# Patient Record
Sex: Female | Born: 1990 | Race: Black or African American | Hispanic: No | Marital: Single | Smoking: Never smoker
Health system: Southern US, Community
[De-identification: ages and names within clinical notes are randomized; demographics above are authoritative.]

## PROBLEM LIST (undated history)

## (undated) DIAGNOSIS — G4730 Sleep apnea, unspecified: Secondary | ICD-10-CM

---

## 2019-11-07 ENCOUNTER — Encounter (HOSPITAL_COMMUNITY): Payer: Self-pay | Admitting: Emergency Medicine

## 2019-11-07 ENCOUNTER — Emergency Department (HOSPITAL_COMMUNITY)
Admission: EM | Admit: 2019-11-07 | Discharge: 2019-11-07 | Disposition: A | Discharge status: Home / Self Care | Payer: PRIVATE HEALTH INSURANCE | Attending: Emergency Medicine | Admitting: Emergency Medicine

## 2019-11-07 ENCOUNTER — Other Ambulatory Visit: Payer: Self-pay

## 2019-11-07 DIAGNOSIS — R0981 Nasal congestion: Secondary | ICD-10-CM | POA: Diagnosis not present

## 2019-11-07 DIAGNOSIS — Z5321 Procedure and treatment not carried out due to patient leaving prior to being seen by health care provider: Secondary | ICD-10-CM | POA: Insufficient documentation

## 2019-11-07 HISTORY — DX: Sleep apnea, unspecified: G47.30

## 2019-11-07 NOTE — ED Triage Notes (Signed)
Patient reports nasal congestion onset this week , no cough , respirations unlabored , denies fever or chills .

## 2019-11-07 NOTE — ED Notes (Signed)
Pt states she is feeling better. She has decided to leave.

## 2020-01-26 ENCOUNTER — Other Ambulatory Visit: Payer: Self-pay | Admitting: Obstetrics and Gynecology

## 2020-01-26 DIAGNOSIS — N6310 Unspecified lump in the right breast, unspecified quadrant: Secondary | ICD-10-CM

## 2020-02-13 ENCOUNTER — Other Ambulatory Visit: Payer: Self-pay

## 2020-02-13 ENCOUNTER — Ambulatory Visit
Admission: RE | Admit: 2020-02-13 | Discharge: 2020-02-13 | Disposition: A | Discharge status: Home / Self Care | Payer: PRIVATE HEALTH INSURANCE | Source: Ambulatory Visit | Attending: Obstetrics and Gynecology | Admitting: Obstetrics and Gynecology

## 2020-02-13 DIAGNOSIS — N6310 Unspecified lump in the right breast, unspecified quadrant: Secondary | ICD-10-CM

## 2020-02-13 IMAGING — US US BREAST*R* LIMITED INC AXILLA
1 series · 2 of 2 positions shown · non-contrast
Comparison: None
COMPARISON: None

Addendum:
CLINICAL DATA: 28-year-old patient recently had a clinical physical
exam, with possible palpated in the 6 o'clock region of the right
breast. Patient does feel the lump herself.

EXAM:
ULTRASOUND OF THE RIGHT BREAST
exam, with a possible mass palpated in the 6 o'clock region of the
right breast. The patient does NOT feel the lump herself.
*** End of Addendum ***

[Series 1: us breast*right* limited inc axilla · 0.07mm/px · 2 of 2 slices shown]
[im 1/2]
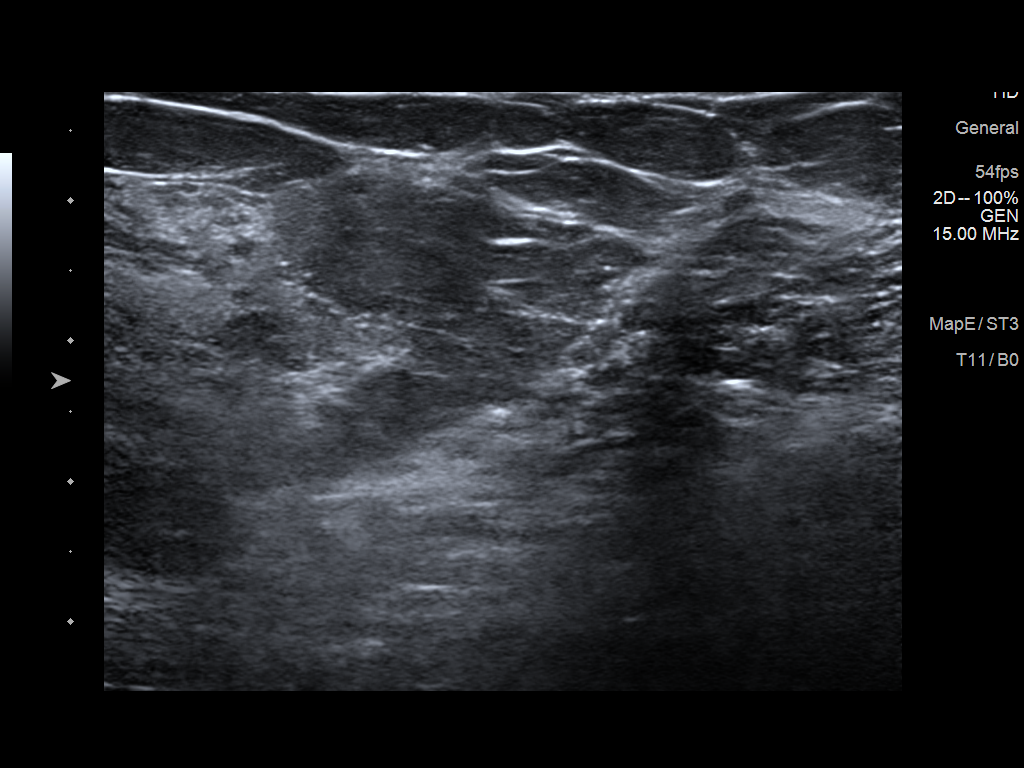
[im 2/2]
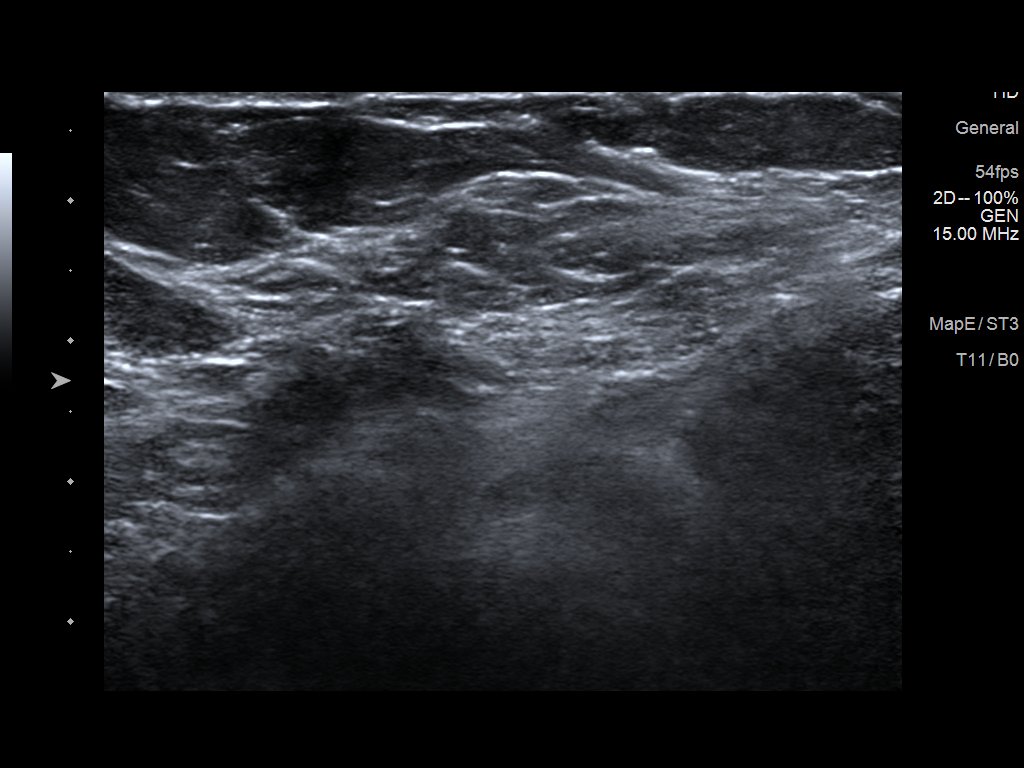

[2 of 2 positions shown; findings below may reference images not displayed]

FINDINGS: Targeted ultrasound is performed, showing normal fibroglandular
tissue in the inferior right breast, with representative pictures
taken in the region of clinical concern as described by the
physician's order at 6 o'clock position 3 cm from the nipple. No
solid or cystic mass or abnormal shadowing is identified.
IMPRESSION: No evidence of malignancy in the right breast. Normal breast
parenchyma in the inferior right breast.

RECOMMENDATION:
Screening mammogram at age 41 unless there are persistent or
intervening clinical concerns. (Code:YY-I-WOA)

I have discussed the findings and recommendations with the patient.
If applicable, a reminder letter will be sent to the patient
regarding the next appointment.

BI-RADS CATEGORY  1: Negative.

ADDENDUM:
This addendum is to correct a voice recognition error in the
clinical data section. The clinical data section should read as
follows:
FINDINGS: Targeted ultrasound is performed, showing normal fibroglandular
tissue in the inferior right breast, with representative pictures
taken in the region of clinical concern as described by the
physician's order at 6 o'clock position 3 cm from the nipple. No
solid or cystic mass or abnormal shadowing is identified.
IMPRESSION: No evidence of malignancy in the right breast. Normal breast
parenchyma in the inferior right breast.

RECOMMENDATION:
Screening mammogram at age 41 unless there are persistent or
intervening clinical concerns. (Code:YY-I-WOA)

I have discussed the findings and recommendations with the patient.
If applicable, a reminder letter will be sent to the patient
regarding the next appointment.

BI-RADS CATEGORY  1: Negative.

## 2021-04-30 ENCOUNTER — Emergency Department
Admission: EM | Admit: 2021-04-30 | Discharge: 2021-04-30 | Disposition: A | Discharge status: Home / Self Care | Payer: PRIVATE HEALTH INSURANCE | Attending: Emergency Medicine | Admitting: Emergency Medicine

## 2021-04-30 ENCOUNTER — Emergency Department: Payer: PRIVATE HEALTH INSURANCE

## 2021-04-30 ENCOUNTER — Other Ambulatory Visit: Payer: Self-pay

## 2021-04-30 DIAGNOSIS — R0789 Other chest pain: Secondary | ICD-10-CM

## 2021-04-30 DIAGNOSIS — R053 Chronic cough: Secondary | ICD-10-CM | POA: Insufficient documentation

## 2021-04-30 LAB — CBC
HCT: 39 % (ref 36.0–46.0)
Hemoglobin: 13.3 g/dL (ref 12.0–15.0)
MCH: 30.3 pg (ref 26.0–34.0)
MCHC: 34.1 g/dL (ref 30.0–36.0)
MCV: 88.8 fL (ref 80.0–100.0)
Platelets: 345 10*3/uL (ref 150–400)
RBC: 4.39 MIL/uL (ref 3.87–5.11)
RDW: 12.4 % (ref 11.5–15.5)
WBC: 6 10*3/uL (ref 4.0–10.5)
nRBC: 0 % (ref 0.0–0.2)

## 2021-04-30 LAB — POC URINE PREG, ED: Preg Test, Ur: NEGATIVE

## 2021-04-30 LAB — BASIC METABOLIC PANEL
Anion gap: 8 (ref 5–15)
BUN: 9 mg/dL (ref 6–20)
CO2: 21 mmol/L — ABNORMAL LOW (ref 22–32)
Calcium: 8.4 mg/dL — ABNORMAL LOW (ref 8.9–10.3)
Chloride: 107 mmol/L (ref 98–111)
Creatinine, Ser: 0.84 mg/dL (ref 0.44–1.00)
GFR, Estimated: >60 mL/min (ref >60)
Glucose, Bld: 104 mg/dL — ABNORMAL HIGH (ref 70–99)
Potassium: 4 mmol/L (ref 3.5–5.1)
Sodium: 136 mmol/L (ref 135–145)

## 2021-04-30 LAB — TROPONIN I (HIGH SENSITIVITY): Troponin I (High Sensitivity): 3 ng/L (ref <18)

## 2021-04-30 LAB — D-DIMER, QUANTITATIVE: D-Dimer, Quant: 0.55 ug/mL-FEU — ABNORMAL HIGH (ref 0.00–0.50)

## 2021-04-30 IMAGING — CR DG CHEST 2V
2 series · 2 of 2 positions shown · non-contrast
Comparison: None.

CLINICAL DATA: Chest pain beginning today.

EXAM:
CHEST - 2 VIEW

[chest pa]
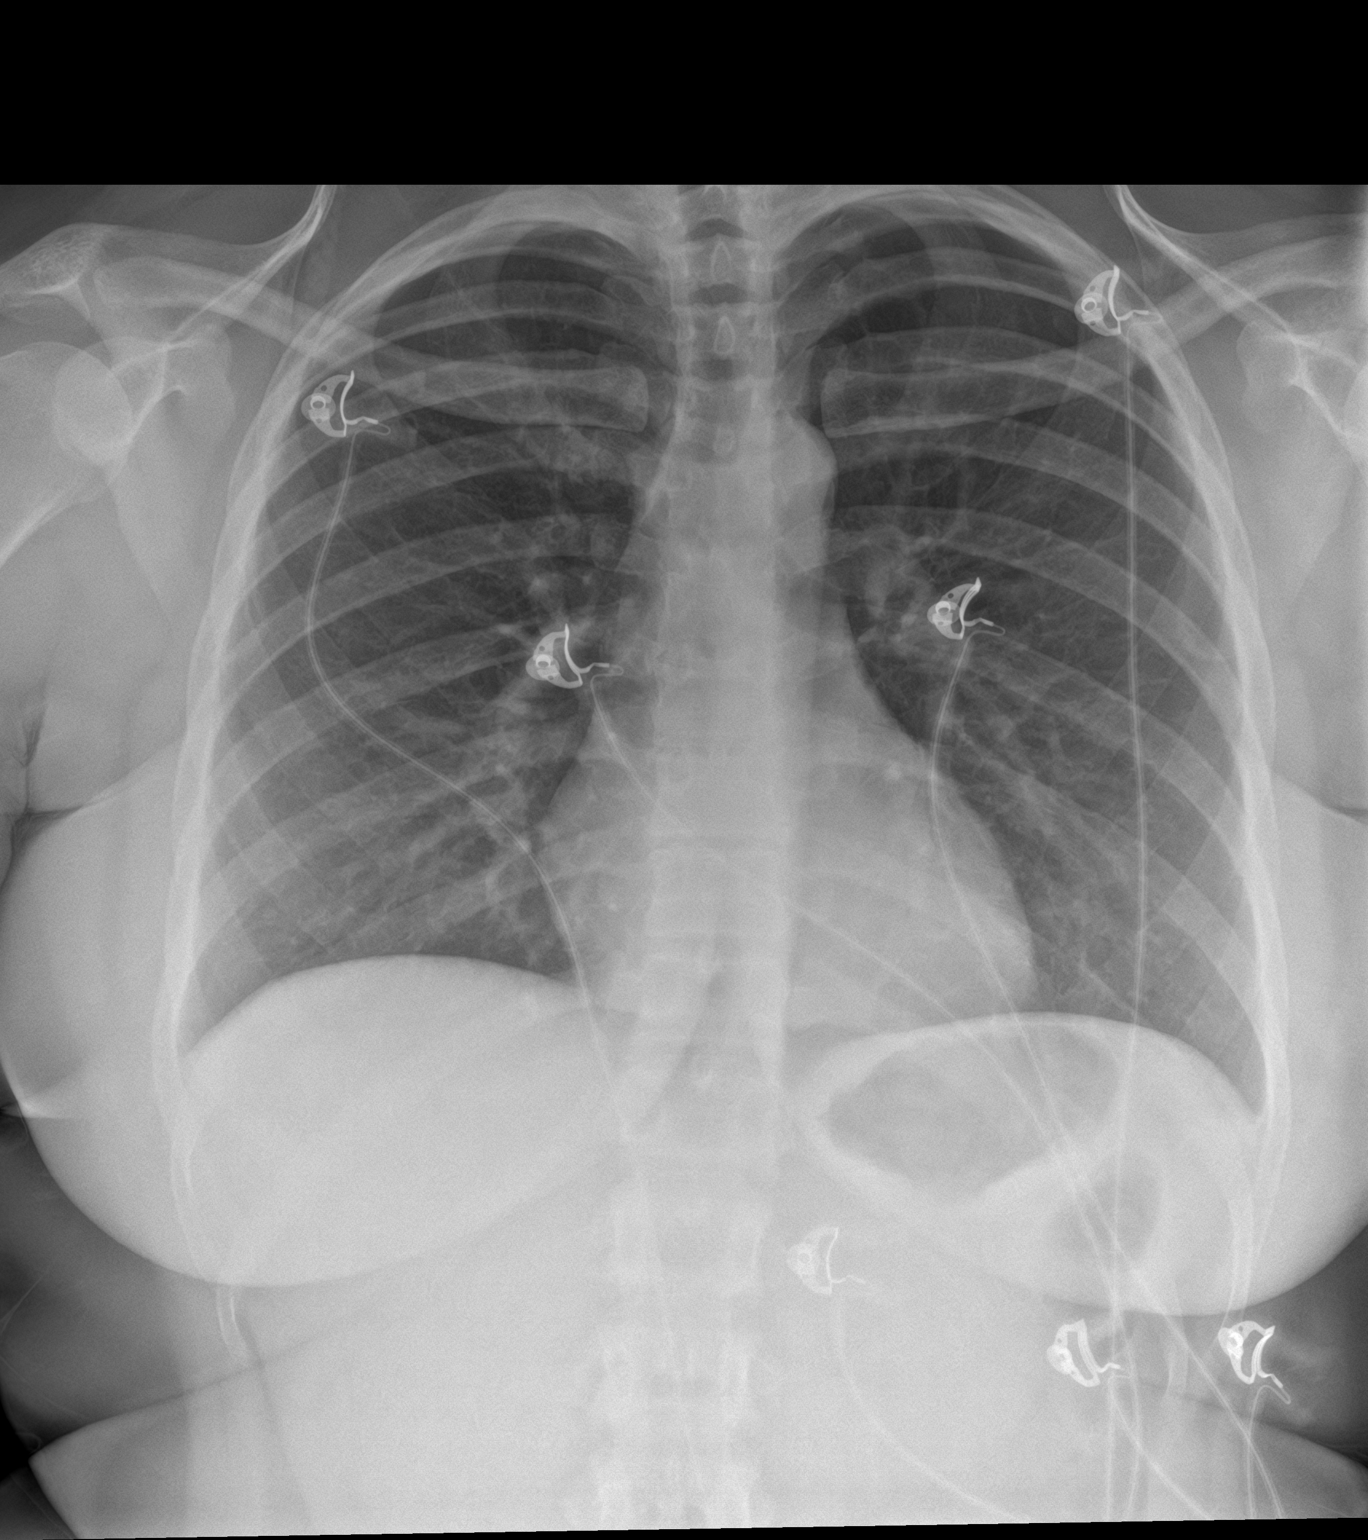

[chest lat]
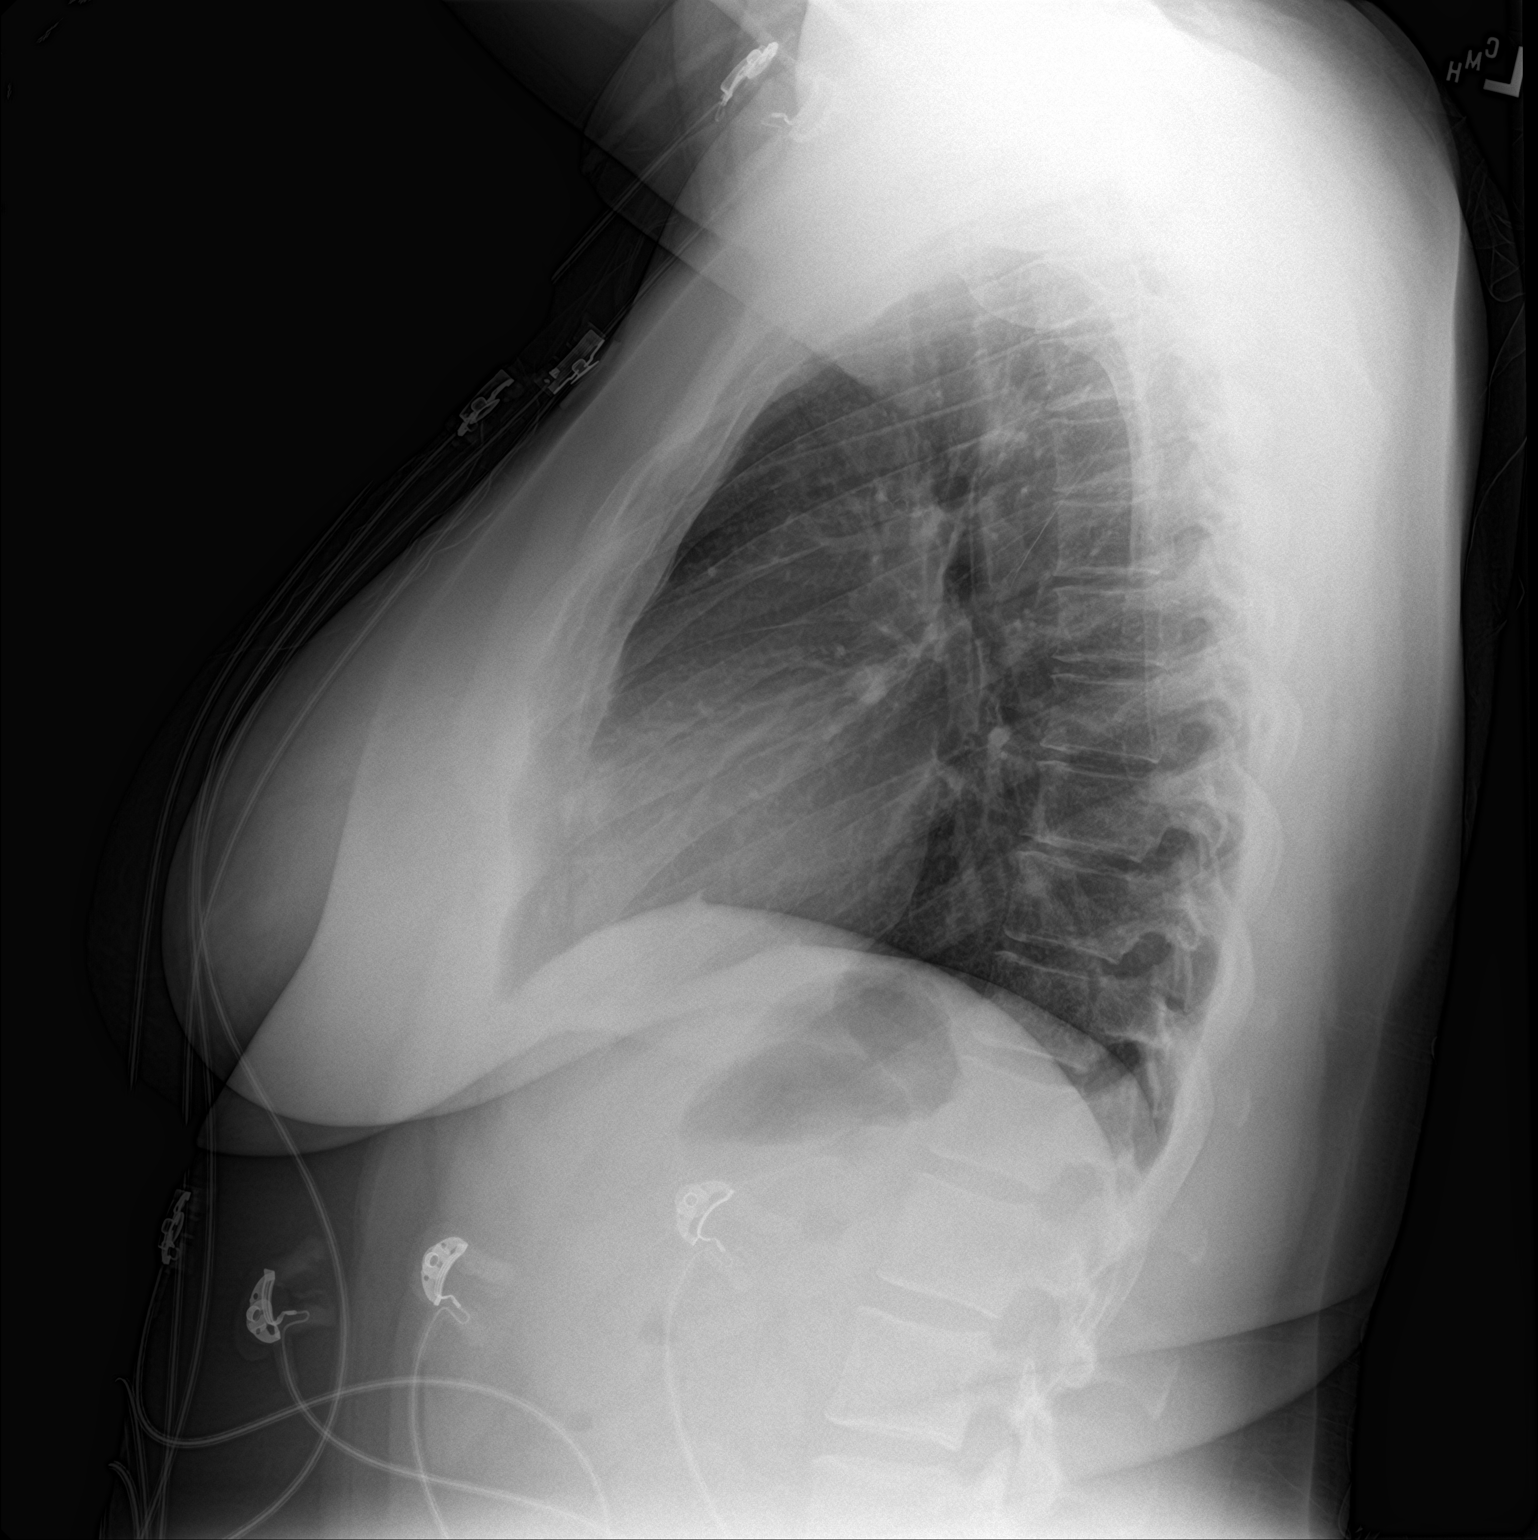

[2 of 2 positions shown; findings below may reference images not displayed]

FINDINGS: Heart size is normal. Mediastinal shadows are normal. The lungs are
clear. No bronchial thickening. No infiltrate, mass, effusion or
collapse. Pulmonary vascularity is normal. No bony abnormality.
IMPRESSION: Normal chest

## 2021-04-30 IMAGING — CT CT ANGIO CHEST
2 of 6 series · 19 of 46 positions shown · IV contrast (APPLIED)
Comparison: None.

CLINICAL DATA: PE suspected, left-sided chest pain

EXAM:
CT ANGIOGRAPHY CHEST WITH CONTRAST
TECHNIQUE: Multidetector CT imaging of the chest was performed using the
standard protocol during bolus administration of intravenous
contrast. Multiplanar CT image reconstructions and MIPs were
obtained to evaluate the vascular anatomy.
CONTRAST:  75mL OMNIPAQUE IOHEXOL 350 MG/ML SOLN

[Series 5: thins · axial · 0.68mm/px · z∈[-86,+167]mm · 16 of 347 slices shown]
[im 15/347  lung]
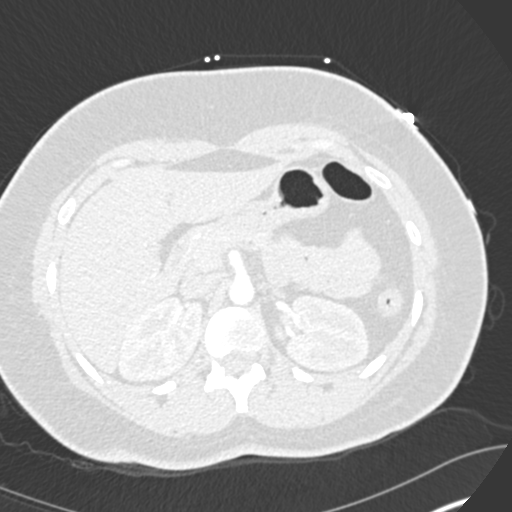
[im 44/347  soft-tissue]
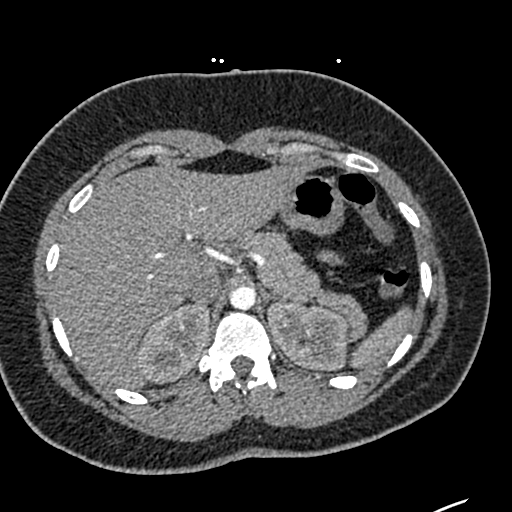
[im 58/347  lung]
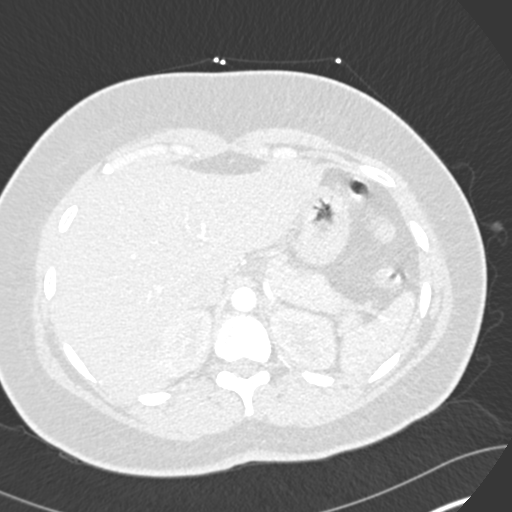
[im 87/347  soft-tissue]
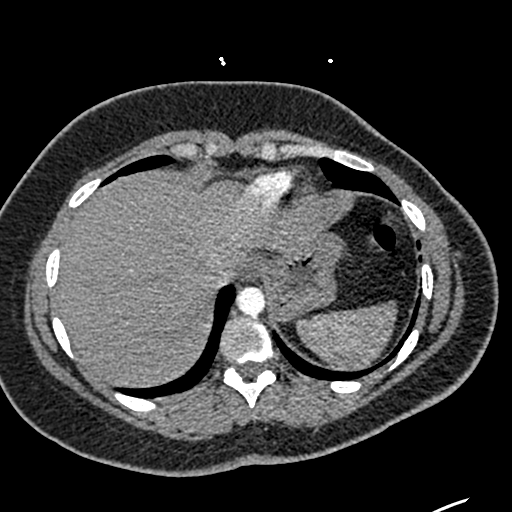
[im 101/347  lung]
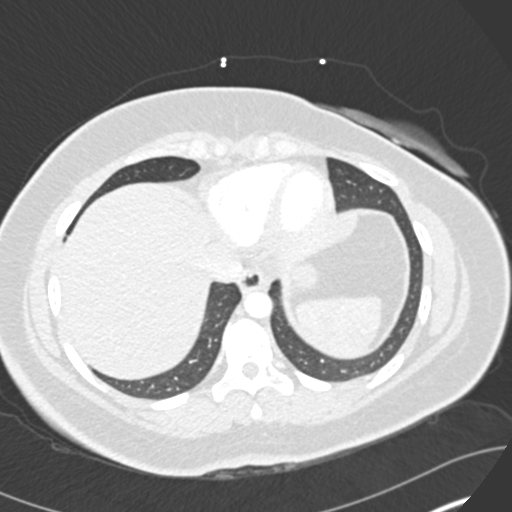
[im 116/347  soft-tissue]
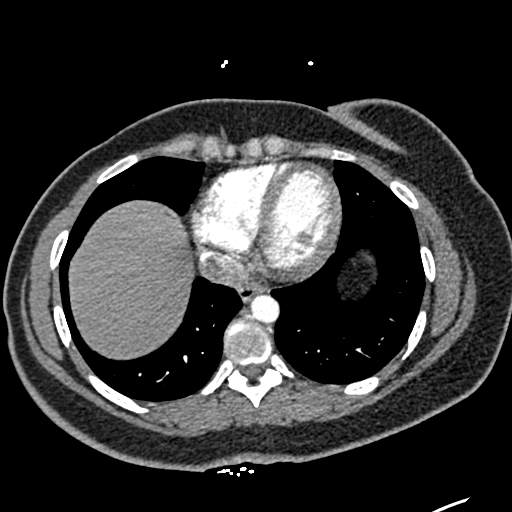
[im 145/347  lung]
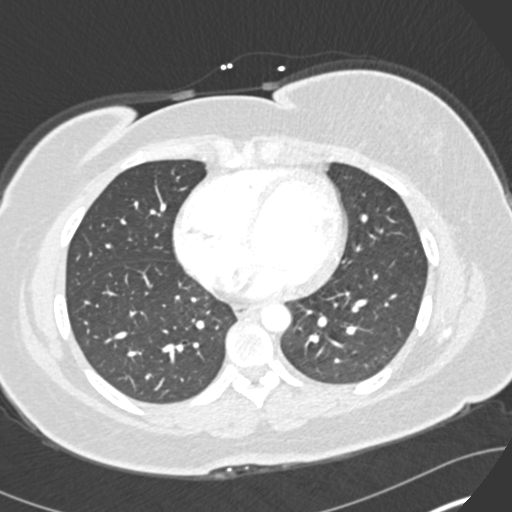
[im 159/347  soft-tissue]
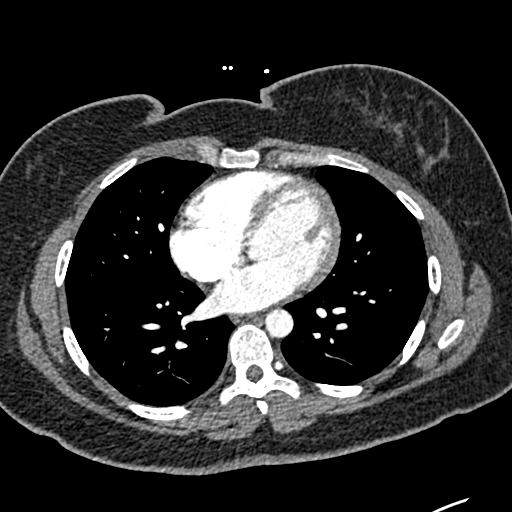
[im 188/347  lung]
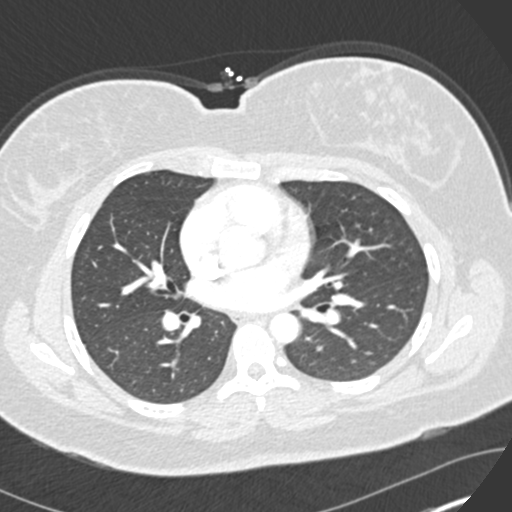
[im 202/347  soft-tissue]
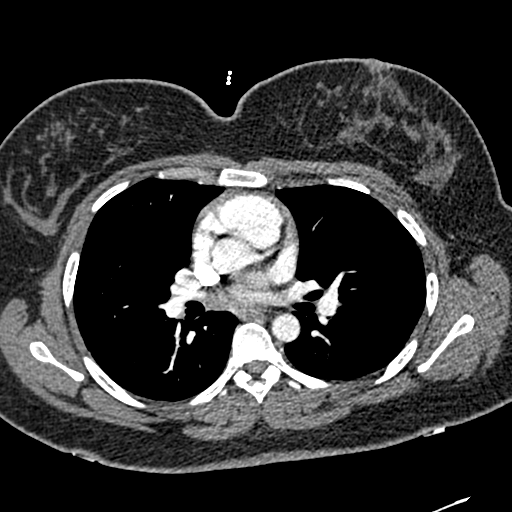
[im 231/347  lung]
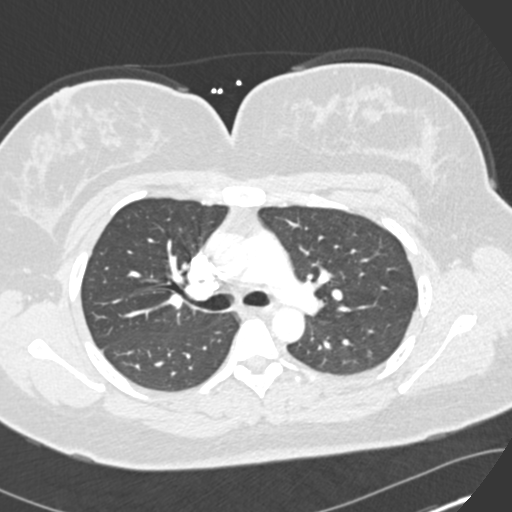
[im 246/347  soft-tissue]
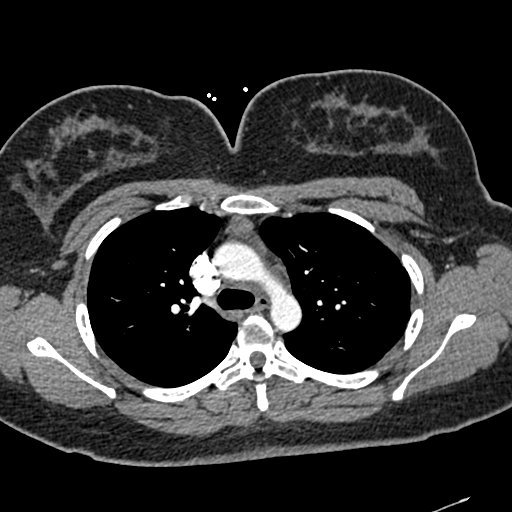
[im 260/347  lung]
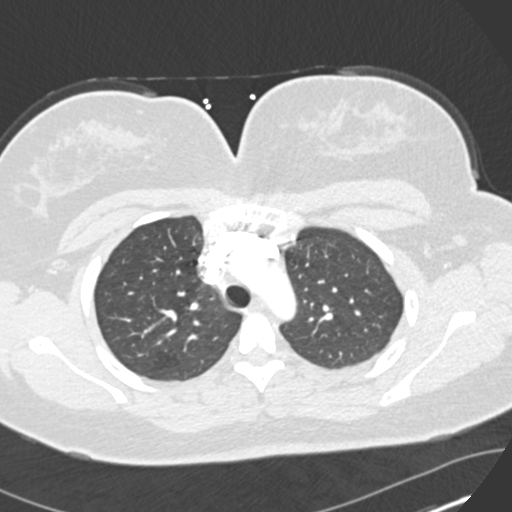
[im 289/347  soft-tissue]
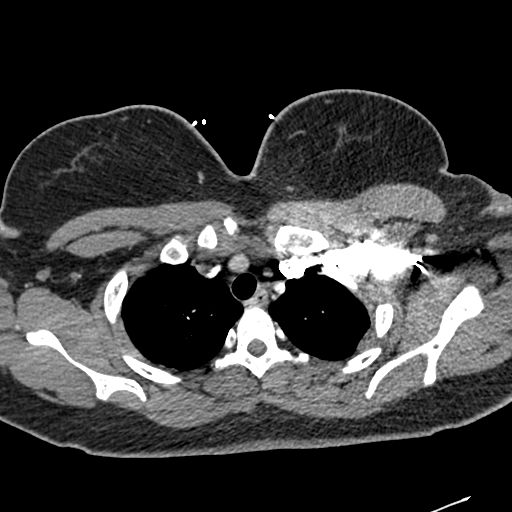
[im 303/347  lung]
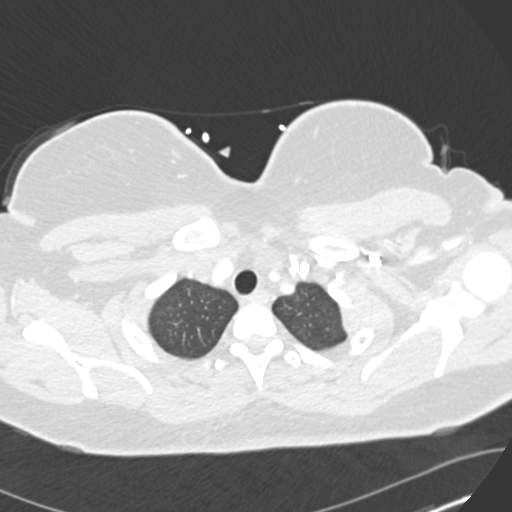
[im 332/347  soft-tissue]
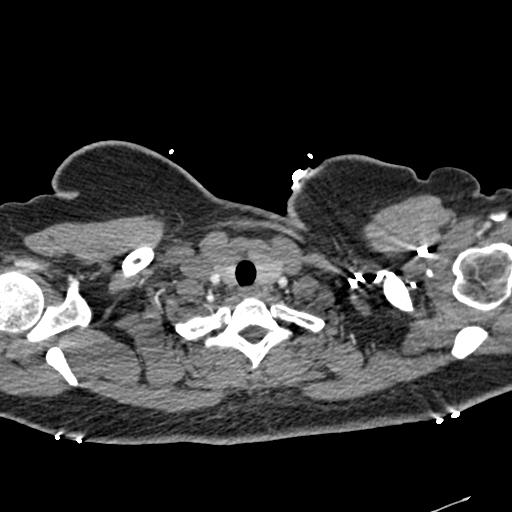

[Series 7: coronal mpr · coronal · 0.54mm/px · 3 of 79 slices shown]
[im 20/79  soft-tissue]
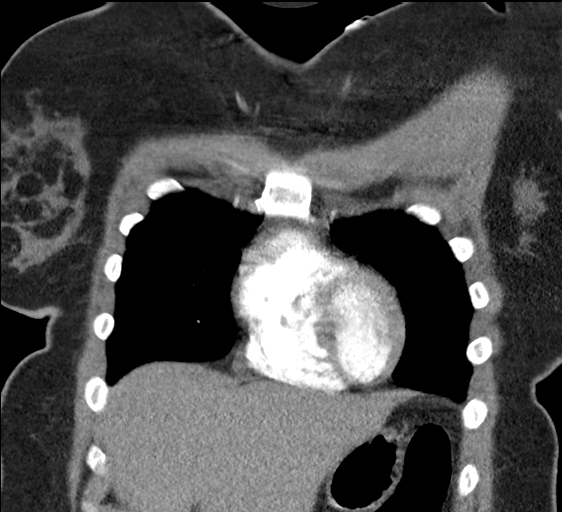
[im 40/79  soft-tissue]
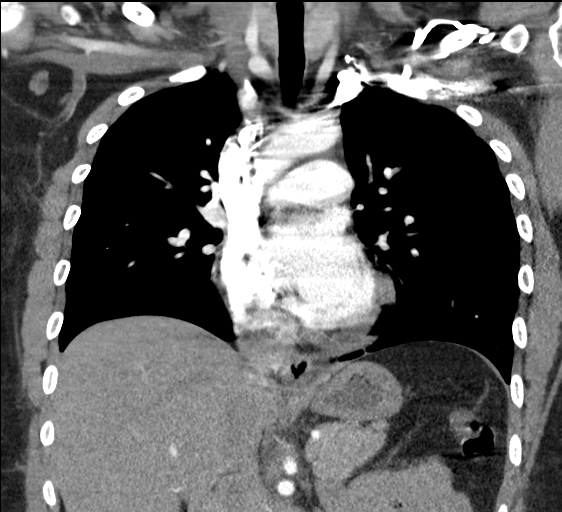
[im 59/79  soft-tissue]
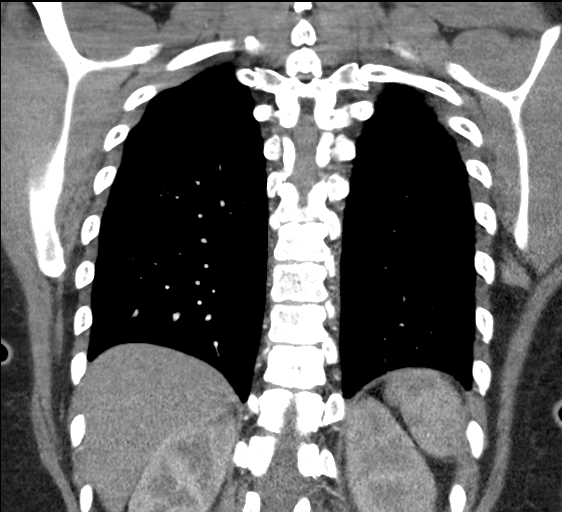

[19 of 46 positions shown; findings below may reference images not displayed]

FINDINGS: Cardiovascular: Satisfactory opacification of the pulmonary arteries
to the segmental level. No evidence of pulmonary embolism. Normal
heart size. No pericardial effusion.

Mediastinum/Nodes: No enlarged mediastinal, hilar, or axillary lymph
nodes. Thymic remnant in the anterior mediastinum. Thyroid gland,
trachea, and esophagus demonstrate no significant findings.

Lungs/Pleura: Lungs are clear. No pleural effusion or pneumothorax.

Upper Abdomen: No acute abnormality.

Musculoskeletal: No chest wall abnormality. No acute or significant
osseous findings.

Review of the MIP images confirms the above findings.
IMPRESSION: Negative examination for pulmonary embolism.

## 2021-04-30 MED ORDER — NAPROXEN 375 MG PO TABS: 375.0000 mg | ORAL_TABLET | Freq: Two times a day (BID) | ORAL | 0 refills | Status: AC

## 2021-04-30 MED ORDER — IOHEXOL 350 MG/ML SOLN
75.0000 mL | Freq: Once | INTRAVENOUS | Status: AC | PRN
  Administered 2021-04-30: 75 mL via INTRAVENOUS

## 2021-04-30 NOTE — ED Triage Notes (Signed)
Pt c/o sharp pain in the left side of chest that started about ago while at work. Denies SOB or other sx, pt is a/ox4, ambulatory with a steady gait, no acute distress noted.

## 2021-04-30 NOTE — Discharge Instructions (Signed)
Take the Naproxen twice a day for 5 days for inflammation and pain  No heavy lifting >15 lb for 1 week

## 2021-04-30 NOTE — ED Provider Notes (Signed)
[facility] Emergency Department Provider Note  ____________________________________________   Event Date/Time   First MD Initiated Contact with Patient 04/30/21 [id number]     (approximate)  I have reviewed the triage vital signs and the nursing notes.   HISTORY  Chief Complaint Chest Pain    HPI Cynthia Atkins is a 30 y.o. female with history of sleep apnea, GERD, here with chest pain.  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  The pain is worse with movement and palpation.  Denies any associated cough hemoptysis.  No history of PE or DVT.  She is on blood thinners.  Denies any recent fever, chills, or illnesses.  She does have a chronic cough which she attributes possibly to allergies versus GERD.  This is not any worse than usual.  Denies any known family history of DVT or PE.  No other complaints.        Past Medical History:  Diagnosis Date  . Sleep apnea     There are no problems to display for this patient.   History reviewed. No pertinent surgical history.  Prior to Admission medications   Medication Sig Start Date End Date Taking? Authorizing Provider  naproxen (NAPROSYN) 375 MG tablet Take 1 tablet (375 mg total) by mouth 2 (two) times daily with a meal for 5 days. 04/30/21 05/05/21 Yes Shaune Pollack, MD    Allergies Patient has no known allergies.  No family history on file.  Social History Social History   Tobacco Use  . Smoking status: Never Smoker  . Smokeless tobacco: Never Used  Substance Use Topics  . Alcohol use: Never  . Drug use: Never    Review of Systems  Review of Systems  Constitutional: Negative for fatigue and fever.  HENT: Negative for congestion and sore throat.   Eyes: Negative for visual disturbance.  Respiratory: Positive for cough. Negative for shortness of breath.    Cardiovascular: Positive for chest pain.  Gastrointestinal: Negative for abdominal pain, diarrhea, nausea and vomiting.  Genitourinary: Negative for flank pain.  Musculoskeletal: Negative for back pain and neck pain.  Skin: Negative for rash and wound.  Neurological: Negative for weakness.  All other systems reviewed and are negative.    ____________________________________________  PHYSICAL EXAM:      VITAL SIGNS: ED Triage Vitals  Enc Vitals Group     BP 04/30/21 0837 120/80     Pulse Rate 04/30/21 0837 86     Resp 04/30/21 0837 17     Temp 04/30/21 0841 98.3 F (36.8 C)     Temp Source 04/30/21 0837 Oral     SpO2 04/30/21 0837 100 %     Weight 04/30/21 0837 212 lb (96.2 kg)     Height 04/30/21 0837 5\' 7"  (1.702 m)     Head Circumference --      Peak Flow --      Pain Score 04/30/21 0837 8     Pain Loc --      Pain Edu? --      Excl. in GC? --      Physical Exam Vitals and nursing note reviewed.  Constitutional:      General: She is not in acute distress.    Appearance: She is well-developed.  HENT:     Head: Normocephalic and atraumatic.  Eyes:     Conjunctiva/sclera:  Conjunctivae normal.  Cardiovascular:     Rate and Rhythm: Normal rate and regular rhythm.     Heart sounds: Normal heart sounds. No murmur heard. No friction rub.  Pulmonary:     Effort: Pulmonary effort is normal. No respiratory distress.     Breath sounds: Normal breath sounds. No wheezing or rales.  Abdominal:     General: There is no distension.     Palpations: Abdomen is soft.     Tenderness: There is no abdominal tenderness.  Musculoskeletal:     Cervical back: Neck supple.  Skin:    General: Skin is warm.     Capillary Refill: Capillary refill takes less than 2 seconds.  Neurological:     Mental Status: She is alert and oriented to person, place, and time.     Motor: No abnormal muscle tone.       ____________________________________________   LABS (all labs ordered are  listed, but only abnormal results are displayed)  Labs Reviewed  BASIC METABOLIC PANEL - Abnormal; Notable for the following components:      Result Value   CO2 21 (*)    Glucose, Bld 104 (*)    Calcium 8.4 (*)    All other components within normal limits  D-DIMER, QUANTITATIVE - Abnormal; Notable for the following components:   D-Dimer, Quant 0.55 (*)    All other components within normal limits  CBC  POC URINE PREG, ED  TROPONIN I (HIGH SENSITIVITY)  TROPONIN I (HIGH SENSITIVITY)    ____________________________________________  EKG: Normal sinus rhythm, ventricular rate 83.  PR 152, QRS 75, QTc 418.  Nonspecific T wave changes.  No acute ST elevations or depressions. ________________________________________  RADIOLOGY All imaging, including plain films, CT scans, and ultrasounds, independently reviewed by me, and interpretations confirmed via formal radiology reads.  ED MD interpretation:   Chest x-ray: Clear CT angio: Negative  Official radiology report(s): DG Chest 2 View  Result Date: 04/30/2021 CLINICAL DATA:  Chest pain beginning today. EXAM: CHEST - 2 VIEW COMPARISON:  None. FINDINGS: Heart size is normal. Mediastinal shadows are normal. The lungs are clear. No bronchial thickening. No infiltrate, mass, effusion or collapse. Pulmonary vascularity is normal. No bony abnormality. IMPRESSION: Normal chest Electronically Signed   By: Paulina Fusi M.D.   On: 04/30/2021 09:28   CT Angio Chest PE W and/or Wo Contrast  Result Date: 04/30/2021 CLINICAL DATA:  PE suspected, left-sided chest pain EXAM: CT ANGIOGRAPHY CHEST WITH CONTRAST TECHNIQUE: Multidetector CT imaging of the chest was performed using the standard protocol during bolus administration of intravenous contrast. Multiplanar CT image reconstructions and MIPs were obtained to evaluate the vascular anatomy. CONTRAST:  73mL OMNIPAQUE IOHEXOL 350 MG/ML SOLN COMPARISON:  None. FINDINGS: Cardiovascular: Satisfactory  opacification of the pulmonary arteries to the segmental level. No evidence of pulmonary embolism. Normal heart size. No pericardial effusion. Mediastinum/Nodes: No enlarged mediastinal, hilar, or axillary lymph nodes. Thymic remnant in the anterior mediastinum. Thyroid gland, trachea, and esophagus demonstrate no significant findings. Lungs/Pleura: Lungs are clear. No pleural effusion or pneumothorax. Upper Abdomen: No acute abnormality. Musculoskeletal: No chest wall abnormality. No acute or significant osseous findings. Review of the MIP images confirms the above findings. IMPRESSION: Negative examination for pulmonary embolism. Electronically Signed   By: Lauralyn Primes M.D.   On: 04/30/2021 10:32    ____________________________________________  PROCEDURES   Procedure(s) performed (including Critical Care):  Procedures  ____________________________________________  INITIAL IMPRESSION / MDM / ASSESSMENT AND PLAN / ED COURSE  As part of my medical decision making, I reviewed the following data within the electronic MEDICAL RECORD NUMBER Nursing notes reviewed and incorporated, Old chart reviewed, Notes from prior ED visits, and [place] Controlled Substance Database       *Cynthia Atkins was evaluated in Emergency Department on 04/30/2021 for the symptoms described in the history of present illness. She was evaluated in the context of the global COVID-19 pandemic, which necessitated consideration that the patient might be at risk for infection with the SARS-CoV-2 virus that causes COVID-19. Institutional protocols and algorithms that pertain to the evaluation of patients at risk for COVID-19 are in a state of rapid change based on information released by regulatory bodies including the CDC and federal and state organizations. These policies and algorithms were followed during the patient's care in the ED.  Some ED evaluations and interventions may be delayed as a result of limited staffing during the  pandemic.*     Medical Decision Making: 30 year old well-appearing female here with reproducible, positional, left upper chest pain.  Suspect musculoskeletal chest pain.  She does some lifting and bending at work.  Exam is unremarkable.  EKG nonischemic and troponin negative despite constant symptoms greater than 6 hours, making ACS unlikely.  D-dimer sent for screening given OCP use, positive, so CT angio obtained and reviewed by me.  There is no evidence of acute PE or dissection.  Lab work is otherwise unremarkable.  Given reassuring labs and imaging, will discharge with treatment for likely musculoskeletal chest wall pain.  ____________________________________________  FINAL CLINICAL IMPRESSION(S) / ED DIAGNOSES  Final diagnoses:  Chest wall pain     MEDICATIONS GIVEN DURING THIS VISIT:  Medications  iohexol (OMNIPAQUE) 350 MG/ML injection 75 mL (75 mLs Intravenous Contrast Given 04/30/21 1009)     ED Discharge Orders         Ordered    naproxen (NAPROSYN) 375 MG tablet  2 times daily with meals        04/30/21 1037           Note:  This document was prepared using Dragon voice recognition software and may include unintentional dictation errors.   Shaune Pollack, MD 04/30/21 1038

## 2021-08-02 ENCOUNTER — Ambulatory Visit: Payer: Self-pay

## 2021-08-02 ENCOUNTER — Encounter: Payer: Self-pay | Admitting: Emergency Medicine

## 2021-08-02 ENCOUNTER — Ambulatory Visit
Admission: EM | Admit: 2021-08-02 | Discharge: 2021-08-02 | Disposition: A | Discharge status: Home / Self Care | Payer: PRIVATE HEALTH INSURANCE | Attending: Emergency Medicine | Admitting: Emergency Medicine

## 2021-08-02 ENCOUNTER — Other Ambulatory Visit: Payer: Self-pay

## 2021-08-02 DIAGNOSIS — G5601 Carpal tunnel syndrome, right upper limb: Secondary | ICD-10-CM

## 2021-08-02 MED ORDER — PREDNISONE 10 MG PO TABS: ORAL_TABLET | ORAL | 0 refills | Status: AC

## 2021-08-02 NOTE — ED Provider Notes (Signed)
Chief Complaint   Chief Complaint  Patient presents with   Hand Pain     Subjective, HPI  Jadalyn Bary Leriche Boggan is a 30 y.o. female who presents with right hand discomfort for which she states is hurting more than usual.  Patient reports the palm of her hand being swollen and her little finger along with her ring finger being numb.  Patient reports a history of this issue, but states that it had gotten better then now she has noticed that it seems to have worsened.  Patient does report that the Velcro on the brace bothers her skin.  Patient reports that she has previously been prescribed naproxen which did not seem to help.  No fever, chills, recent trauma to the area or surgeries.  History obtained from patient.  Patient's problem list, past medical and social history, medications, and allergies were reviewed by me and updated in Epic.    ROS  See HPI.  Objective   Vitals:   08/02/21 1836  BP: 110/67  Pulse: 85  Resp: 16  Temp: 98.6 F (37 C)  SpO2: 97%    Vital signs and nursing note reviewed.   General: Appears well-developed and well-nourished. No acute distress.  Head: Normocephalic and atraumatic.   Neck: Normal range of motion, neck is supple.  Cardiovascular: Normal rate. Pulm/Chest: No respiratory distress.  Musculoskeletal: Right hand: No TTP noted.  No notable swelling.  No erythema.  Full range of motion.  5/5 strength, full sensation, 2+  pulses, < 2 sec cap refill.  Neurological: Alert and oriented to person, place, and time.  Skin: Skin is warm and dry.   Psychiatric: Normal mood, affect, behavior, and thought content.    Data  No results found for any visits on 08/02/21.     Assessment & Plan  1. Carpal tunnel syndrome of right wrist - predniSONE (DELTASONE) 10 MG tablet; Take 3 tablets (30 mg total) by mouth daily with breakfast for 2 days, THEN 2 tablets (20 mg total) daily with breakfast for 2 days, THEN 1 tablet (10 mg total) daily with breakfast  for 2 days.  Dispense: 12 tablet; Refill: 0  30 y.o. female presents with right hand discomfort for which she states is hurting more than usual.  Patient reports the palm of her hand being swollen and her little finger along with her ring finger being numb.  Patient reports a history of this issue, but states that it had gotten better then now she has noticed that it seems to have worsened.  Patient does report that the Velcro on the brace bothers her skin.  Patient reports that she has previously been prescribed naproxen which did not seem to help.  No fever, chills, recent trauma to the area or surgeries.  Chart review completed.  Given symptoms and examination in clinic today, likely carpal tunnel.  Advised of at home treatment and care to include following up with [facility] as she is established with them for this same issue.  Did Rx a low-dose prednisone taper to the patient's preferred pharmacy to help with inflammation.  Advised wrist brace at night.  Patient verbalized understanding and agreed with plan.  Patient stable upon discharge.  Return as needed.  Plan:   Discharge Instructions      Take prednisone as prescribed  Use brace to right wrist at night  Follow up with [facility] as directed as you are established with them for this issue  Amalia Greenhouse, [place] 08/02/21 1918

## 2021-08-02 NOTE — ED Triage Notes (Signed)
Pt said yesterday her right hand started hurting more than usual and patient said her palm of hand was swollen with her pinky and ring finger being numb.

## 2021-08-02 NOTE — Discharge Instructions (Addendum)
Take prednisone as prescribed  Use brace to right wrist at night  Follow up with [facility] as directed as you are established with them for this issue

## 2022-01-01 ENCOUNTER — Ambulatory Visit
Admission: EM | Admit: 2022-01-01 | Discharge: 2022-01-01 | Disposition: A | Discharge status: Home / Self Care | Payer: PRIVATE HEALTH INSURANCE | Attending: Internal Medicine | Admitting: Internal Medicine

## 2022-01-01 ENCOUNTER — Other Ambulatory Visit: Payer: Self-pay

## 2022-01-01 DIAGNOSIS — L02411 Cutaneous abscess of right axilla: Secondary | ICD-10-CM | POA: Diagnosis not present

## 2022-01-01 MED ORDER — DOXYCYCLINE HYCLATE 100 MG PO CAPS: 100.0000 mg | ORAL_CAPSULE | Freq: Two times a day (BID) | ORAL | 0 refills | Status: DC

## 2022-01-01 NOTE — ED Triage Notes (Signed)
Pt c/o abscess to upper right chest first noticed ~ 1 week ago but started to become erythematous yesterday. States has a condition she cannot pronounced that is "HS something?" Where she gets bumps in underarm and groin areas.

## 2022-01-01 NOTE — Discharge Instructions (Signed)
You are being treated with antibiotic.  Please follow-up if symptoms persist or worsen.

## 2022-01-01 NOTE — ED Provider Notes (Signed)
[facility]    CSN: [id number] Arrival date & time: 01/01/22  1541      History   Chief Complaint Chief Complaint  Patient presents with   Abscess    HPI Cynthia Atkins is a 31 y.o. female.   Patient presents with possible abscess to right axilla that has been present for approximately 1 week.  Patient reports that she has a history of hydradenitis suppurativa.  She has abscesses that she has to have antibiotics for and sometimes they resolve on their own.  Patient reports that this one became more red and inflamed yesterday.  Denies any fevers, body aches, chills.  Denies any drainage from abscess.   Abscess  Past Medical History:  Diagnosis Date   Sleep apnea     There are no problems to display for this patient.   History reviewed. No pertinent surgical history.  OB History   No obstetric history on file.      Home Medications    Prior to Admission medications   Medication Sig Start Date End Date Taking? Authorizing Provider  doxycycline (VIBRAMYCIN) 100 MG capsule Take 1 capsule (100 mg total) by mouth 2 (two) times daily. 01/01/22  Yes Gustavus Bryant, FNP    Family History History reviewed. No pertinent family history.  Social History Social History   Tobacco Use   Smoking status: Never   Smokeless tobacco: Never  Substance Use Topics   Alcohol use: Never   Drug use: Never     Allergies   Patient has no known allergies.   Review of Systems Review of Systems Per HPI  Physical Exam Triage Vital Signs ED Triage Vitals  Enc Vitals Group     BP 01/01/22 1548 123/79     Pulse Rate 01/01/22 1548 84     Resp 01/01/22 1548 18     Temp 01/01/22 1548 98.7 F (37.1 C)     Temp Source 01/01/22 1548 Oral     SpO2 01/01/22 1548 98 %     Weight --      Height --      Head Circumference --      Peak Flow --      Pain Score 01/01/22 1549 0     Pain Loc --      Pain Edu? --      Excl. in GC? --    No data found.  Updated  Vital Signs BP 123/79 (BP Location: Left Arm)    Pulse 84    Temp 98.7 F (37.1 C) (Oral)    Resp 18    SpO2 98%   Visual Acuity Right Eye Distance:   Left Eye Distance:   Bilateral Distance:    Right Eye Near:   Left Eye Near:    Bilateral Near:     Physical Exam Constitutional:      General: She is not in acute distress.    Appearance: Normal appearance. She is not toxic-appearing or diaphoretic.  HENT:     Head: Normocephalic and atraumatic.  Eyes:     Extraocular Movements: Extraocular movements intact.     Conjunctiva/sclera: Conjunctivae normal.  Pulmonary:     Effort: Pulmonary effort is normal.  Skin:    General: Skin is warm and dry.     Findings: Abscess present.     Comments: Approximately 1.5 cm in diameter indurated abscess present to right axilla.  No drainage noted.  Neurological:     General: No focal deficit  present.     Mental Status: She is alert and oriented to person, place, and time. Mental status is at baseline.  Psychiatric:        Mood and Affect: Mood normal.        Behavior: Behavior normal.        Thought Content: Thought content normal.        Judgment: Judgment normal.     UC Treatments / Results  Labs (all labs ordered are listed, but only abnormal results are displayed) Labs Reviewed - No data to display  EKG   Radiology No results found.  Procedures Procedures (including critical care time)  Medications Ordered in UC Medications - No data to display  Initial Impression / Assessment and Plan / UC Course  I have reviewed the triage vital signs and the nursing notes.  Pertinent labs & imaging results that were available during my care of the patient were reviewed by me and considered in my medical decision making (see chart for details).     Will treat right axilla abscess with doxycycline antibiotic.  No I&D indicated at this time.  Discussed return precautions.  Patient verbalized understanding and was agreeable with  plan. Final Clinical Impressions(s) / UC Diagnoses   Final diagnoses:  Abscess of right axilla     Discharge Instructions      You are being treated with antibiotic.  Please follow-up if symptoms persist or worsen.    ED Prescriptions     Medication Sig Dispense Auth. Provider   doxycycline (VIBRAMYCIN) 100 MG capsule Take 1 capsule (100 mg total) by mouth 2 (two) times daily. 20 capsule Gustavus Bryant, [place]      PDMP not reviewed this encounter.   Gustavus Bryant, [place] 01/01/22 [phone]

## 2022-04-24 ENCOUNTER — Ambulatory Visit
Admission: EM | Admit: 2022-04-24 | Discharge: 2022-04-24 | Disposition: A | Discharge status: Home / Self Care | Payer: PRIVATE HEALTH INSURANCE | Attending: Student | Admitting: Student

## 2022-04-24 DIAGNOSIS — J0100 Acute maxillary sinusitis, unspecified: Secondary | ICD-10-CM | POA: Diagnosis not present

## 2022-04-24 MED ORDER — AMOXICILLIN-POT CLAVULANATE 875-125 MG PO TABS: 1.0000 | ORAL_TABLET | Freq: Two times a day (BID) | ORAL | 0 refills | Status: DC

## 2022-04-24 NOTE — ED Provider Notes (Signed)
Cynthia Atkins    CSN: [id number] Arrival date & time: 04/24/22  1302      History   Chief Complaint Chief Complaint  Patient presents with   Cough   Nasal Congestion    HPI Cynthia Atkins is a 31 y.o. female presenting with congestion and cough x2 days. History noncontributory. Describes nasal congestion, cough productive of yellow sputum. States she did a home neti pot treatment and her sinuses actually feel worse after with burning and pressure. She was sick about 1 month ago and thinks she might have had a sinus infection then, but this eventually got better on its own.  Today she is without shortness of breath, chest pain, dizziness, weakness, known fevers.  Has attempted DayQuil with some relief.  HPI  Past Medical History:  Diagnosis Date   Sleep apnea     There are no problems to display for this patient.   History reviewed. No pertinent surgical history.  OB History   No obstetric history on file.      Home Medications    Prior to Admission medications   Medication Sig Start Date End Date Taking? Authorizing Provider  amoxicillin-clavulanate (AUGMENTIN) 875-125 MG tablet Take 1 tablet by mouth every 12 (twelve) hours. 04/24/22  Yes Rhys Martini, PA-C    Family History History reviewed. No pertinent family history.  Social History Social History   Tobacco Use   Smoking status: Never   Smokeless tobacco: Never  Substance Use Topics   Alcohol use: Never   Drug use: Never     Allergies   Dust mite extract and Peanut oil   Review of Systems Review of Systems  Constitutional:  Negative for appetite change, chills and fever.  HENT:  Positive for congestion. Negative for ear pain, rhinorrhea, sinus pressure, sinus pain and sore throat.   Eyes:  Negative for redness and visual disturbance.  Respiratory:  Positive for cough. Negative for chest tightness, shortness of breath and wheezing.   Cardiovascular:  Negative for chest pain and  palpitations.  Gastrointestinal:  Negative for abdominal pain, constipation, diarrhea, nausea and vomiting.  Genitourinary:  Negative for dysuria, frequency and urgency.  Musculoskeletal:  Negative for myalgias.  Neurological:  Negative for dizziness, weakness and headaches.  Psychiatric/Behavioral:  Negative for confusion.   All other systems reviewed and are negative.   Physical Exam Triage Vital Signs ED Triage Vitals [04/24/22 1317]  Enc Vitals Group     BP 120/82     Pulse Rate 96     Resp 18     Temp 97.9 F (36.6 C)     Temp src      SpO2 98 %     Weight      Height      Head Circumference      Peak Flow      Pain Score 0     Pain Loc      Pain Edu?      Excl. in GC?    No data found.  Updated Vital Signs BP 120/82   Pulse 96   Temp 97.9 F (36.6 C)   Resp 18   LMP 03/31/2022   SpO2 98%   Visual Acuity Right Eye Distance:   Left Eye Distance:   Bilateral Distance:    Right Eye Near:   Left Eye Near:    Bilateral Near:     Physical Exam Vitals reviewed.  Constitutional:      General: She is  not in acute distress.    Appearance: Normal appearance. She is not ill-appearing.  HENT:     Head: Normocephalic and atraumatic.     Right Ear: Tympanic membrane, ear canal and external ear normal. No tenderness. No middle ear effusion. There is no impacted cerumen. Tympanic membrane is not perforated, erythematous, retracted or bulging.     Left Ear: Tympanic membrane, ear canal and external ear normal. No tenderness.  No middle ear effusion. There is no impacted cerumen. Tympanic membrane is not perforated, erythematous, retracted or bulging.     Nose: Rhinorrhea present. No congestion.     Mouth/Throat:     Mouth: Mucous membranes are moist.     Pharynx: Uvula midline. No oropharyngeal exudate or posterior oropharyngeal erythema.     Tonsils: 2+ on the right. 2+ on the left.     Comments: Tonsils are 2+ with minimal erythema and no exudate. On exam, uvula is  midline, she is tolerating her secretions without difficulty, there is no trismus, no drooling, she has normal phonation  Eyes:     Extraocular Movements: Extraocular movements intact.     Pupils: Pupils are equal, round, and reactive to light.  Cardiovascular:     Rate and Rhythm: Normal rate and regular rhythm.     Heart sounds: Normal heart sounds.  Pulmonary:     Effort: Pulmonary effort is normal.     Breath sounds: Normal breath sounds. No decreased breath sounds, wheezing, rhonchi or rales.  Abdominal:     Palpations: Abdomen is soft.     Tenderness: There is no abdominal tenderness. There is no guarding or rebound.  Lymphadenopathy:     Cervical: No cervical adenopathy.     Right cervical: No superficial cervical adenopathy.    Left cervical: No superficial cervical adenopathy.  Neurological:     General: No focal deficit present.     Mental Status: She is alert and oriented to person, place, and time.  Psychiatric:        Mood and Affect: Mood normal.        Behavior: Behavior normal.        Thought Content: Thought content normal.        Judgment: Judgment normal.     UC Treatments / Results  Labs (all labs ordered are listed, but only abnormal results are displayed) Labs Reviewed - No data to display  EKG   Radiology No results found.  Procedures Procedures (including critical care time)  Medications Ordered in UC Medications - No data to display  Initial Impression / Assessment and Plan / UC Course  I have reviewed the triage vital signs and the nursing notes.  Pertinent labs & imaging results that were available during my care of the patient were reviewed by me and considered in my medical decision making (see chart for details).     This patient is a very pleasant 31 y.o. year old female presenting with sinusitis following viral syndrome. Afebrile, nontachy, oxygenating well on room air with no adventitious breath sounds. LMP 03/31/22, States she is  not pregnant or breastfeeding.  There is no associated SOB, CP, tachycardia, unilateral leg swelling, etc. Wells score PE is 0.   Augmentin sent. Continue OTC medications for additional relief.   ED return precautions discussed. Patient verbalizes understanding and agreement.    Final Clinical Impressions(s) / UC Diagnoses   Final diagnoses:  Acute non-recurrent maxillary sinusitis     Discharge Instructions      -Start  the antibiotic-Augmentin (amoxicillin-clavulanate), 1 pill every 12 hours for 7 days.  You can take this with food like with breakfast and dinner. -Mucinex or Dayquil -Continue daily allergy medication  -Follow-up if cough gets worse, including shortness of breath, coughing up dark or red sputum, etc   ED Prescriptions     Medication Sig Dispense Auth. Provider   amoxicillin-clavulanate (AUGMENTIN) 875-125 MG tablet Take 1 tablet by mouth every 12 (twelve) hours. 14 tablet Rhys Martini, PA-C      PDMP not reviewed this encounter.   Rhys Martini, PA-C 04/24/22 1335

## 2022-04-24 NOTE — ED Triage Notes (Signed)
Patient presents to Urgent Care with complaints of cough and nasal congestion x 2 days. Treating symptoms with nyquil and dayquil.

## 2022-04-24 NOTE — Discharge Instructions (Addendum)
-  Start the antibiotic-Augmentin (amoxicillin-clavulanate), 1 pill every 12 hours for 7 days.  You can take this with food like with breakfast and dinner. -Mucinex or Dayquil -Continue daily allergy medication  -Follow-up if cough gets worse, including shortness of breath, coughing up dark or red sputum, etc

## 2023-04-09 ENCOUNTER — Ambulatory Visit
Admission: EM | Admit: 2023-04-09 | Discharge: 2023-04-09 | Disposition: A | Discharge status: Home / Self Care | Payer: PRIVATE HEALTH INSURANCE | Attending: Emergency Medicine | Admitting: Emergency Medicine

## 2023-04-09 DIAGNOSIS — H5789 Other specified disorders of eye and adnexa: Secondary | ICD-10-CM | POA: Diagnosis not present

## 2023-04-09 DIAGNOSIS — H578A1 Foreign body sensation, right eye: Secondary | ICD-10-CM

## 2023-04-09 MED ORDER — POLYMYXIN B-TRIMETHOPRIM 10000-0.1 UNIT/ML-% OP SOLN: 1.0000 [drp] | Freq: Four times a day (QID) | OPHTHALMIC | 0 refills | Status: AC

## 2023-04-09 NOTE — ED Provider Notes (Signed)
[name]    CSN: [id number] Arrival date & time: 04/09/23  1541      History   Chief Complaint Chief Complaint  Patient presents with   Eye Problem    HPI Cynthia Atkins is a 32 y.o. female.  Patient presents with right eye irritation and foreign body sensation.  She wears contact lenses and took them out last night but is concerned that there may be a piece of contact in her eye.  She wears daily contacts so she did not inspect her contact lenses when she took them out last night.  She has been wearing her glasses today.  She denies eye pain, eye injury, change in vision, eye drainage, fever, or other symptoms.  Treatment attempted with OTC eyedrops.  The history is provided by the patient and medical records.    Past Medical History:  Diagnosis Date   Sleep apnea     There are no problems to display for this patient.   History reviewed. No pertinent surgical history.  OB History   No obstetric history on file.      Home Medications    Prior to Admission medications   Medication Sig Start Date End Date Taking? Authorizing Provider  trimethoprim-polymyxin b (POLYTRIM) ophthalmic solution Place 1 drop into the right eye 4 (four) times daily for 7 days. 04/09/23 04/16/23 Yes Mickie Bail, NP  amoxicillin-clavulanate (AUGMENTIN) 875-125 MG tablet Take 1 tablet by mouth every 12 (twelve) hours. Patient not taking: Reported on 04/09/2023 04/24/22   Rhys Martini, PA-C    Family History No family history on file.  Social History Social History   Tobacco Use   Smoking status: Never   Smokeless tobacco: Never  Substance Use Topics   Alcohol use: Never   Drug use: Never     Allergies   Dust mite extract and Peanut oil   Review of Systems Review of Systems  Constitutional:  Negative for chills and fever.  HENT:  Negative for ear pain and sore throat.   Eyes:  Positive for pain and redness. Negative for discharge, itching and visual  disturbance.  Respiratory:  Negative for cough and shortness of breath.   Skin:  Negative for color change, rash and wound.     Physical Exam Triage Vital Signs ED Triage Vitals  Enc Vitals Group     BP 04/09/23 1554 118/82     Pulse Rate 04/09/23 1550 91     Resp 04/09/23 1550 18     Temp 04/09/23 1550 98.5 F (36.9 C)     Temp src --      SpO2 04/09/23 1550 96 %     Weight --      Height --      Head Circumference --      Peak Flow --      Pain Score 04/09/23 1552 1     Pain Loc --      Pain Edu? --      Excl. in GC? --    No data found.  Updated Vital Signs BP 118/82   Pulse 91   Temp 98.5 F (36.9 C)   Resp 18   LMP 04/07/2023   SpO2 96%   Visual Acuity Right Eye Distance:   Left Eye Distance:   Bilateral Distance:    Right Eye Near:   Left Eye Near:    Bilateral Near:     Physical Exam Vitals and nursing note reviewed.  Constitutional:  General: She is not in acute distress.    Appearance: Normal appearance. She is well-developed. She is not ill-appearing.  HENT:     Head: Normocephalic and atraumatic.     Right Ear: Tympanic membrane normal.     Left Ear: Tympanic membrane normal.     Nose: Nose normal.     Mouth/Throat:     Mouth: Mucous membranes are moist.     Pharynx: Oropharynx is clear.  Eyes:     General: Lids are normal. Lids are everted, no foreign bodies appreciated. Vision grossly intact.     Extraocular Movements: Extraocular movements intact.     Conjunctiva/sclera: Conjunctivae normal.     Right eye: Right conjunctiva is not injected.     Left eye: Left conjunctiva is not injected.     Pupils: Pupils are equal, round, and reactive to light.     Right eye: No fluorescein uptake.  Cardiovascular:     Rate and Rhythm: Normal rate and regular rhythm.     Heart sounds: Normal heart sounds.  Pulmonary:     Effort: Pulmonary effort is normal. No respiratory distress.     Breath sounds: Normal breath sounds.  Musculoskeletal:      Cervical back: Neck supple.  Skin:    General: Skin is warm and dry.  Neurological:     Mental Status: She is alert.  Psychiatric:        Mood and Affect: Mood normal.        Behavior: Behavior normal.      UC Treatments / Results  Labs (all labs ordered are listed, but only abnormal results are displayed) Labs Reviewed - No data to display  EKG   Radiology No results found.  Procedures Procedures (including critical care time)  Medications Ordered in UC Medications - No data to display  Initial Impression / Assessment and Plan / UC Course  I have reviewed the triage vital signs and the nursing notes.  Pertinent labs & imaging results that were available during my care of the patient were reviewed by me and considered in my medical decision making (see chart for details).    Right eye irritation and sensation of foreign body.  No fluorescein uptake.  No foreign body noted.  Treating with Polytrim eyedrops.  Instructed patient to follow-up with her eye care provider tomorrow.  ED precautions given.  Patient agrees to plan of care.  Final Clinical Impressions(s) / UC Diagnoses   Final diagnoses:  Irritation of right eye  Sensation of foreign body in right eye     Discharge Instructions      Use the antibiotic eyedrops as prescribed.    Follow-up with your eye care provider.    Go to the emergency department if you have acute eye pain, changes in your vision, or other concerning symptoms.        ED Prescriptions     Medication Sig Dispense Auth. Provider   trimethoprim-polymyxin b (POLYTRIM) ophthalmic solution Place 1 drop into the right eye 4 (four) times daily for 7 days. 10 mL Mickie Bail, NP      PDMP not reviewed this encounter.   Mickie Bail, NP 04/09/23 [phone]

## 2023-04-09 NOTE — Discharge Instructions (Addendum)
Use the antibiotic eyedrops as prescribed.    Follow-up with your eye care provider.    Go to the emergency department if you have acute eye pain, changes in your vision, or other concerning symptoms.

## 2023-04-09 NOTE — ED Triage Notes (Signed)
Patient to Urgent Care with complaints of right sided eye irritation. Reports symptoms started this morning. Describes feeling like she has a piece of a contact stuck in there. Removed contacts last night, did not feel at the time there was an issue.

## 2023-05-21 ENCOUNTER — Ambulatory Visit
Admission: EM | Admit: 2023-05-21 | Discharge: 2023-05-21 | Disposition: A | Discharge status: Home / Self Care | Payer: PRIVATE HEALTH INSURANCE | Attending: Emergency Medicine | Admitting: Emergency Medicine

## 2023-05-21 DIAGNOSIS — L02213 Cutaneous abscess of chest wall: Secondary | ICD-10-CM | POA: Diagnosis not present

## 2023-05-21 MED ORDER — DOXYCYCLINE HYCLATE 100 MG PO CAPS: 100.0000 mg | ORAL_CAPSULE | Freq: Two times a day (BID) | ORAL | 0 refills | Status: AC

## 2023-05-21 NOTE — ED Triage Notes (Addendum)
Patient to Urgent Care with complaints of an abscess present to the upper/ right side of her chest. Denies any known fevers.   Symptoms started 10 days ago. Has not had any drainage. Completed a tele-doc visit and was prescribed augmentin.

## 2023-05-21 NOTE — Discharge Instructions (Addendum)
Take the doxycycline as directed.  Follow up with your primary care provider for recheck of wound in 1-2 days.

## 2023-05-21 NOTE — ED Provider Notes (Signed)
[name]    CSN: [id number] Arrival date & time: 05/21/23  1011      History   Chief Complaint Chief Complaint  Patient presents with   Abscess    HPI Cynthia Atkins is a 32 y.o. female.  Patient presents with a skin abscess on her right chest wall x 10 days.  She had an e-visit and was prescribed Augmentin; she is taken 5 days of this.  The abscess is improving and has gotten softer.  No open wounds or drainage.  No fever, chills, or other symptoms.  No OTC medications taken today.  She denies current pregnancy or breastfeeding.     The history is provided by the patient and medical records.    Past Medical History:  Diagnosis Date   Sleep apnea     There are no problems to display for this patient.   History reviewed. No pertinent surgical history.  OB History   No obstetric history on file.      Home Medications    Prior to Admission medications   Medication Sig Start Date End Date Taking? Authorizing Provider  doxycycline (VIBRAMYCIN) 100 MG capsule Take 1 capsule (100 mg total) by mouth 2 (two) times daily for 7 days. 05/21/23 05/28/23 Yes Mickie Bail, NP  amoxicillin-clavulanate (AUGMENTIN) 875-125 MG tablet Take 1 tablet by mouth every 12 (twelve) hours. 04/24/22   Rhys Martini, PA-C    Family History History reviewed. No pertinent family history.  Social History Social History   Tobacco Use   Smoking status: Never   Smokeless tobacco: Never  Substance Use Topics   Alcohol use: Never   Drug use: Never     Allergies   Dust mite extract and Peanut oil   Review of Systems Review of Systems  Constitutional:  Negative for chills and fever.  Respiratory:  Negative for cough and shortness of breath.   Cardiovascular:  Negative for chest pain and palpitations.  Skin:  Positive for color change and wound.     Physical Exam Triage Vital Signs ED Triage Vitals  Enc Vitals Group     BP 05/21/23 1033 109/75     Pulse Rate  05/21/23 1020 79     Resp 05/21/23 1020 18     Temp 05/21/23 1020 98.2 F (36.8 C)     Temp src --      SpO2 05/21/23 1020 97 %     Weight --      Height --      Head Circumference --      Peak Flow --      Pain Score 05/21/23 1032 3     Pain Loc --      Pain Edu? --      Excl. in GC? --    No data found.  Updated Vital Signs BP 109/75   Pulse 79   Temp 98.2 F (36.8 C)   Resp 18   LMP 05/04/2023   SpO2 97%   Visual Acuity Right Eye Distance:   Left Eye Distance:   Bilateral Distance:    Right Eye Near:   Left Eye Near:    Bilateral Near:     Physical Exam Vitals and nursing note reviewed.  Constitutional:      General: She is not in acute distress.    Appearance: She is well-developed.  HENT:     Mouth/Throat:     Mouth: Mucous membranes are moist.  Cardiovascular:  Rate and Rhythm: Normal rate and regular rhythm.     Heart sounds: Normal heart sounds.  Pulmonary:     Effort: Pulmonary effort is normal. No respiratory distress.     Breath sounds: Normal breath sounds.  Musculoskeletal:     Cervical back: Neck supple.  Skin:    General: Skin is warm and dry.     Findings: Lesion present.     Comments: Right upper chest wall: 3 cm x 4 cm area of tender fluctuant induration with localized erythema.  No open wounds or drainage.    Neurological:     Mental Status: She is alert.  Psychiatric:        Mood and Affect: Mood normal.        Behavior: Behavior normal.      UC Treatments / Results  Labs (all labs ordered are listed, but only abnormal results are displayed) Labs Reviewed - No data to display  EKG   Radiology No results found.  Procedures Incision and Drainage  Date/Time: 05/21/2023 11:13 AM  Performed by: Mickie Bail, NP Authorized by: Mickie Bail, NP   Consent:    Consent obtained:  Verbal   Consent given by:  Patient   Risks discussed:  Bleeding, incomplete drainage, infection and pain Universal protocol:    Procedure  explained and questions answered to patient or proxy's satisfaction: yes   Location:    Type:  Abscess   Location:  Trunk   Trunk location:  Chest Pre-procedure details:    Skin preparation:  Povidone-iodine Anesthesia:    Anesthesia method:  Local infiltration   Local anesthetic:  Lidocaine 1% w/o epi Procedure type:    Complexity:  Simple Procedure details:    Incision types:  Single straight   Drainage:  Purulent   Drainage amount:  Scant   Wound treatment:  Wound left open   Packing materials:  None Post-procedure details:    Procedure completion:  Tolerated well, no immediate complications  (including critical care time)  Medications Ordered in UC Medications - No data to display  Initial Impression / Assessment and Plan / UC Course  I have reviewed the triage vital signs and the nursing notes.  Pertinent labs & imaging results that were available during my care of the patient were reviewed by me and considered in my medical decision making (see chart for details).   Abscess of right chest wall.  Afebrile and vital signs are stable.  Patient has been on Augmentin for 5 days.  The abscess has become fluctuant.  I&D performed today with scant purulent drainage.  Changing antibiotic to doxycycline today.  Instructed patient to follow-up with her PCP for a recheck in 1 to 2 days.  ED precautions discussed.  Wound care instructions and signs of worsening infection discussed.  Education provided on abscess.  Patient agrees to plan of care.   Final Clinical Impressions(s) / UC Diagnoses   Final diagnoses:  Cutaneous abscess of chest wall     Discharge Instructions      Take the doxycycline as directed.  Follow up with your primary care provider for recheck of wound in 1-2 days.        ED Prescriptions     Medication Sig Dispense Auth. Provider   doxycycline (VIBRAMYCIN) 100 MG capsule Take 1 capsule (100 mg total) by mouth 2 (two) times daily for 7 days. 14 capsule  Mickie Bail, NP      PDMP not reviewed this encounter.  Mickie Bail, NP 05/21/23 1115

## 2023-09-27 ENCOUNTER — Emergency Department: Payer: PRIVATE HEALTH INSURANCE

## 2023-09-27 ENCOUNTER — Emergency Department
Admission: EM | Admit: 2023-09-27 | Discharge: 2023-09-27 | Disposition: A | Discharge status: Home / Self Care | Payer: PRIVATE HEALTH INSURANCE | Attending: Emergency Medicine | Admitting: Emergency Medicine

## 2023-09-27 DIAGNOSIS — R059 Cough, unspecified: Secondary | ICD-10-CM | POA: Diagnosis present

## 2023-09-27 DIAGNOSIS — Z20822 Contact with and (suspected) exposure to covid-19: Secondary | ICD-10-CM | POA: Insufficient documentation

## 2023-09-27 DIAGNOSIS — J181 Lobar pneumonia, unspecified organism: Secondary | ICD-10-CM | POA: Insufficient documentation

## 2023-09-27 DIAGNOSIS — J189 Pneumonia, unspecified organism: Secondary | ICD-10-CM

## 2023-09-27 LAB — RESP PANEL BY RT-PCR (RSV, FLU A&B, COVID)  RVPGX2
Influenza A by PCR: NEGATIVE
Influenza B by PCR: NEGATIVE
Resp Syncytial Virus by PCR: NEGATIVE
SARS Coronavirus 2 by RT PCR: NEGATIVE

## 2023-09-27 MED ORDER — HYDROCOD POLI-CHLORPHE POLI ER 10-8 MG/5ML PO SUER: 5.0000 mL | Freq: Every evening | ORAL | 0 refills | Status: DC | PRN

## 2023-09-27 MED ORDER — DOXYCYCLINE HYCLATE 100 MG PO TABS: 100.0000 mg | ORAL_TABLET | Freq: Two times a day (BID) | ORAL | 0 refills | Status: AC

## 2023-09-27 MED ORDER — FLUCONAZOLE 150 MG PO TABS: 150.0000 mg | ORAL_TABLET | Freq: Once | ORAL | 0 refills | Status: AC

## 2023-09-27 MED ORDER — DOXYCYCLINE HYCLATE 100 MG PO TABS
100.0000 mg | ORAL_TABLET | Freq: Once | ORAL | Status: AC
  Administered 2023-09-27: 100 mg via ORAL
  Filled 2023-09-27: qty 1

## 2023-09-27 NOTE — Discharge Instructions (Signed)
Please take Tylenol and ibuprofen/Advil for your pain.  It is safe to take them together, or to alternate them every few hours.  Take up to 1000mg of Tylenol at a time, up to 4 times per day.  Do not take more than 4000 mg of Tylenol in 24 hours.  For ibuprofen, take 400-600 mg, 3 - 4 times per day.  

## 2023-09-27 NOTE — ED Provider Notes (Signed)
[facility] Provider Note    Event Date/Time   First MD Initiated Contact with Patient 09/27/23 0459     (approximate)   History   Cough (Pt presents to ED from home via POV. Pt c/o persistent, productive cough for the past 4 days. Pt denies fever, sweats, or chills. Pt denies SOB or CP. Pt is ambulatory in triage. Pt is a and ox4. Pt resp are even and unlabored. Pt skin is dry and warm and appropriate for ethnicity. )   HPI  Cynthia Atkins is a 32 y.o. female who presents to the ED for evaluation of Cough (Pt presents to ED from home via POV. Pt c/o persistent, productive cough for the past 4 days. Pt denies fever, sweats, or chills. Pt denies SOB or CP. Pt is ambulatory in triage. Pt is a and ox4. Pt resp are even and unlabored. Pt skin is dry and warm and appropriate for ethnicity. )   Patient presents for evaluation of about a week of progressively worsening productive cough, poor sleep.  No chest pain, syncope, emesis or shortness of breath   Physical Exam   Triage Vital Signs: ED Triage Vitals [09/27/23 0411]  Encounter Vitals Group     BP (!) 141/90     Systolic BP Percentile      Diastolic BP Percentile      Pulse Rate 81     Resp 18     Temp 98.6 F (37 C)     Temp Source Oral     SpO2 100 %     Weight 212 lb (96.2 kg)     Height 5\' 8"  (1.727 m)     Head Circumference      Peak Flow      Pain Score 0     Pain Loc      Pain Education      Exclude from Growth Chart     Most recent vital signs: Vitals:   09/27/23 0411 09/27/23 0521  BP: (!) 141/90   Pulse: 81   Resp: 18   Temp: 98.6 F (37 C)   SpO2: 100% 99%    General: Awake, no distress.  Well-appearing and conversational CV:  Good peripheral perfusion.  Resp:  Normal effort.  Abd:  No distention.  MSK:  No deformity noted.  Neuro:  No focal deficits appreciated. Other:     ED Results / Procedures / Treatments   Labs (all labs ordered are listed, but only  abnormal results are displayed) Labs Reviewed  RESP PANEL BY RT-PCR (RSV, FLU A&B, COVID)  RVPGX2    EKG   RADIOLOGY 2 view CXR interpreted by me with evidence of left basilar pneumonia  Official radiology report(s): DG Chest 2 View  Result Date: 09/27/2023 CLINICAL DATA:  32 year old female with 4 day history of cough. EXAM: CHEST - 2 VIEW COMPARISON:  Chest x-ray 04/30/2021. FINDINGS: New ill-defined opacity in linear architectural distortion in the left lung base, suggesting a combination of atelectasis and consolidation in the left lower lobe. Right lung is clear. No pleural effusions. No pneumothorax. No evidence of pulmonary edema. Heart size is normal. Upper mediastinal contours are within normal limits. IMPRESSION: 1. New area of atelectasis and/or consolidation in the left lower lobe likely reflecting bronchopneumonia or sequela of recent aspiration. Followup PA and lateral chest X-ray is recommended in 3-4 weeks following trial of antibiotic therapy to ensure resolution and exclude underlying malignancy. Electronically Signed   By: Reuel Boom  Entrikin M.D.   On: 09/27/2023 05:21    PROCEDURES and INTERVENTIONS:  Procedures  Medications  doxycycline (VIBRA-TABS) tablet 100 mg (100 mg Oral Given 09/27/23 0539)     IMPRESSION / MDM / ASSESSMENT AND PLAN / ED COURSE  I reviewed the triage vital signs and the nursing notes.  Differential diagnosis includes, but is not limited to, community-acquired pneumonia, wheezing, pneumothorax  {Patient presents with symptoms of an acute illness or injury that is potentially life-threatening.  Patient presents with evidence of CAP suitable for outpatient management with antibiotics.      FINAL CLINICAL IMPRESSION(S) / ED DIAGNOSES   Final diagnoses:  Community acquired pneumonia of right lower lobe of lung     Rx / DC Orders   ED Discharge Orders          Ordered    chlorpheniramine-HYDROcodone (TUSSIONEX) 10-8 MG/5ML  At  bedtime PRN        09/27/23 0552    doxycycline (VIBRA-TABS) 100 MG tablet  2 times daily        09/27/23 0552    fluconazole (DIFLUCAN) 150 MG tablet   Once        09/27/23 0552    doxycycline (VIBRA-TABS) 100 MG tablet  2 times daily        Pending    fluconazole (DIFLUCAN) 150 MG tablet   Once        Pending             Note:  This document was prepared using Dragon voice recognition software and may include unintentional dictation errors.   Delton Prairie, MD 09/27/23 [phone]

## 2023-09-27 NOTE — ED Triage Notes (Signed)
Pt presents to ED from home via POV. Pt c/o persistent, productive cough for the past 4 days. Pt denies fever, sweats, or chills. Pt denies SOB or CP. Pt is ambulatory in triage. Pt is a and ox4. Pt resp are even and unlabored. Pt skin is dry and warm and appropriate for ethnicity.

## 2023-10-14 ENCOUNTER — Ambulatory Visit
Admission: EM | Admit: 2023-10-14 | Discharge: 2023-10-14 | Disposition: A | Discharge status: Home / Self Care | Payer: PRIVATE HEALTH INSURANCE | Attending: Emergency Medicine | Admitting: Emergency Medicine

## 2023-10-14 DIAGNOSIS — J069 Acute upper respiratory infection, unspecified: Secondary | ICD-10-CM

## 2023-10-14 MED ORDER — BENZONATATE 100 MG PO CAPS: 100.0000 mg | ORAL_CAPSULE | Freq: Three times a day (TID) | ORAL | 0 refills | Status: DC

## 2023-10-14 MED ORDER — AZITHROMYCIN 250 MG PO TABS: 250.0000 mg | ORAL_TABLET | Freq: Every day | ORAL | 0 refills | Status: DC

## 2023-10-14 NOTE — ED Triage Notes (Signed)
Productive Cough with slight blood tinge sputum x 2 days, with irritated/ scratchy throat. Taking delsym, Flonase, nasal rinse and zyrtec and relief of symptoms.

## 2023-10-14 NOTE — Discharge Instructions (Signed)
Your symptoms today are most likely being caused by a virus and should steadily improve in time it can take up to 7 to 10 days before you truly start to see a turnaround however things will get better, begin azithromycin which is an antibiotic to protect her airways and ideally prevent symptoms from progressing to pneumonia  Use Tessalon pill every 8 hours as needed to help calm coughing  Blood-tinged sputum is most likely result of a dry airway and irritation as you will most recently sick within the last month, you may use a humidifier if you do not have 1 you may steam up the bathroom and sit inside for 10 to 15 minutes throughout the day, ensure that you are drinking lots of fluids and that your environments are not warm stuffy and dry as this will make it more difficult for you to breathe    You can take Tylenol and/or Ibuprofen as needed for fever reduction and pain relief.   For cough: honey 1/2 to 1 teaspoon (you can dilute the honey in water or another fluid).  You can also use guaifenesin and dextromethorphan for cough. You can use a humidifier for chest congestion and cough.  If you don't have a humidifier, you can sit in the bathroom with the hot shower running.      For sore throat: try warm salt water gargles, cepacol lozenges, throat spray, warm tea or water with lemon/honey, popsicles or ice, or OTC cold relief medicine for throat discomfort.   For congestion: take a daily anti-histamine like Zyrtec, Claritin, and a oral decongestant, such as pseudoephedrine.  You can also use Flonase 1-2 sprays in each nostril daily.   It is important to stay hydrated: drink plenty of fluids (water, gatorade/powerade/pedialyte, juices, or teas) to keep your throat moisturized and help further relieve irritation/discomfort.

## 2023-10-14 NOTE — ED Provider Notes (Signed)
Cynthia Atkins    CSN: [id number] Arrival date & time: 10/14/23  0944      History   Chief Complaint Chief Complaint  Patient presents with   Cough    HPI Cynthia Atkins is a 32 y.o. female.   Patient presents for evaluation of of nasal congestion, rhinorrhea, a productive cough, intermittent generalized headaches and a scratchy sore throat beginning 2 days ago.  Had 1 occurrence of blood-tinged sputum this morning.  Has attempted use of Flonase Delsym and Zyrtec.  Tolerating food and liquids.  No known sick contacts.  Recently diagnosed and treated for pneumonia within the last month, repeat chest x-ray completed on October 09, 2023, shows resolution.  denies Respiratory history.  Past Medical History:  Diagnosis Date   Sleep apnea     There are no problems to display for this patient.   History reviewed. No pertinent surgical history.  OB History   No obstetric history on file.      Home Medications    Prior to Admission medications   Medication Sig Start Date End Date Taking? Authorizing Provider  amoxicillin-clavulanate (AUGMENTIN) 875-125 MG tablet Take 1 tablet by mouth every 12 (twelve) hours. 04/24/22  Yes Rhys Martini, PA-C  azithromycin (ZITHROMAX) 250 MG tablet Take 1 tablet (250 mg total) by mouth daily. Take first 2 tablets together, then 1 every day until finished. 10/14/23  Yes Jhostin Epps R, NP  benzonatate (TESSALON) 100 MG capsule Take 1 capsule (100 mg total) by mouth every 8 (eight) hours. 10/14/23  Yes Poetry Cerro R, NP  chlorpheniramine-HYDROcodone (TUSSIONEX) 10-8 MG/5ML Take 5 mLs by mouth at bedtime as needed for cough. 09/27/23  Yes Delton Prairie, MD    Family History History reviewed. No pertinent family history.  Social History Social History   Tobacco Use   Smoking status: Never   Smokeless tobacco: Never  Substance Use Topics   Alcohol use: Never   Drug use: Never     Allergies   Dust mite extract and  Peanut oil   Review of Systems Review of Systems   Physical Exam Triage Vital Signs ED Triage Vitals [10/14/23 1022]  Encounter Vitals Group     BP 113/81     Systolic BP Percentile      Diastolic BP Percentile      Pulse Rate 82     Resp 16     Temp 99.1 F (37.3 C)     Temp Source Oral     SpO2 98 %     Weight      Height      Head Circumference      Peak Flow      Pain Score 0     Pain Loc      Pain Education      Exclude from Growth Chart    No data found.  Updated Vital Signs BP 113/81 (BP Location: Right Arm)   Pulse 82   Temp 99.1 F (37.3 C) (Oral)   Resp 16   LMP 10/06/2023 (Exact Date)   SpO2 98%   Visual Acuity Right Eye Distance:   Left Eye Distance:   Bilateral Distance:    Right Eye Near:   Left Eye Near:    Bilateral Near:     Physical Exam Constitutional:      Appearance: Normal appearance.  HENT:     Right Ear: Tympanic membrane, ear canal and external ear normal.     Left Ear:  Tympanic membrane, ear canal and external ear normal.     Nose: Congestion present. No rhinorrhea.     Mouth/Throat:     Mouth: Mucous membranes are moist.     Pharynx: Oropharynx is clear. No oropharyngeal exudate or posterior oropharyngeal erythema.  Eyes:     Extraocular Movements: Extraocular movements intact.  Cardiovascular:     Rate and Rhythm: Normal rate and regular rhythm.     Pulses: Normal pulses.     Heart sounds: Normal heart sounds.  Pulmonary:     Effort: Pulmonary effort is normal.     Breath sounds: Normal breath sounds.  Neurological:     Mental Status: She is alert and oriented to person, place, and time. Mental status is at baseline.      UC Treatments / Results  Labs (all labs ordered are listed, but only abnormal results are displayed) Labs Reviewed - No data to display  EKG   Radiology No results found.  Procedures Procedures (including critical care time)  Medications Ordered in UC Medications - No data to  display  Initial Impression / Assessment and Plan / UC Course  I have reviewed the triage vital signs and the nursing notes.  Pertinent labs & imaging results that were available during my care of the patient were reviewed by me and considered in my medical decision making (see chart for details).  Acute URI  Patient is in no signs of distress nor toxic appearing.  Vital signs are stable.  Low suspicion for pneumonia, pneumothorax or bronchitis and therefore will defer imaging.  Recently completed x-ray on the eighth therefore will defer today.  Prescribed azithromycin prophylactically and Tessalon for management of cough.May use additional over-the-counter medications as needed for supportive care.  May follow-up with urgent care as needed if symptoms persist or worsen.   Final Clinical Impressions(s) / UC Diagnoses   Final diagnoses:  Acute URI     Discharge Instructions      Your symptoms today are most likely being caused by a virus and should steadily improve in time it can take up to 7 to 10 days before you truly start to see a turnaround however things will get better, begin azithromycin which is an antibiotic to protect her airways and ideally prevent symptoms from progressing to pneumonia  Use Tessalon pill every 8 hours as needed to help calm coughing  Blood-tinged sputum is most likely result of a dry airway and irritation as you will most recently sick within the last month, you may use a humidifier if you do not have 1 you may steam up the bathroom and sit inside for 10 to 15 minutes throughout the day, ensure that you are drinking lots of fluids and that your environments are not warm stuffy and dry as this will make it more difficult for you to breathe    You can take Tylenol and/or Ibuprofen as needed for fever reduction and pain relief.   For cough: honey 1/2 to 1 teaspoon (you can dilute the honey in water or another fluid).  You can also use guaifenesin and  dextromethorphan for cough. You can use a humidifier for chest congestion and cough.  If you don't have a humidifier, you can sit in the bathroom with the hot shower running.      For sore throat: try warm salt water gargles, cepacol lozenges, throat spray, warm tea or water with lemon/honey, popsicles or ice, or OTC cold relief medicine for throat discomfort.  For congestion: take a daily anti-histamine like Zyrtec, Claritin, and a oral decongestant, such as pseudoephedrine.  You can also use Flonase 1-2 sprays in each nostril daily.   It is important to stay hydrated: drink plenty of fluids (water, gatorade/powerade/pedialyte, juices, or teas) to keep your throat moisturized and help further relieve irritation/discomfort.    ED Prescriptions     Medication Sig Dispense Auth. Provider   azithromycin (ZITHROMAX) 250 MG tablet Take 1 tablet (250 mg total) by mouth daily. Take first 2 tablets together, then 1 every day until finished. 6 tablet Shigeru Lampert R, NP   benzonatate (TESSALON) 100 MG capsule Take 1 capsule (100 mg total) by mouth every 8 (eight) hours. 21 capsule Elianys Conry, Elita Boone, NP      PDMP not reviewed this encounter.   Valinda Hoar, NP 10/14/23 1108

## 2023-11-28 ENCOUNTER — Ambulatory Visit: Payer: PRIVATE HEALTH INSURANCE

## 2024-01-30 DIAGNOSIS — Z419 Encounter for procedure for purposes other than remedying health state, unspecified: Secondary | ICD-10-CM | POA: Diagnosis not present

## 2024-02-09 ENCOUNTER — Emergency Department
Admission: EM | Admit: 2024-02-09 | Discharge: 2024-02-09 | Disposition: A | Discharge status: Home / Self Care | Attending: Emergency Medicine | Admitting: Emergency Medicine

## 2024-02-09 ENCOUNTER — Other Ambulatory Visit: Payer: Self-pay

## 2024-02-09 ENCOUNTER — Emergency Department

## 2024-02-09 DIAGNOSIS — B9789 Other viral agents as the cause of diseases classified elsewhere: Secondary | ICD-10-CM | POA: Diagnosis not present

## 2024-02-09 DIAGNOSIS — J069 Acute upper respiratory infection, unspecified: Secondary | ICD-10-CM | POA: Insufficient documentation

## 2024-02-09 DIAGNOSIS — R059 Cough, unspecified: Secondary | ICD-10-CM | POA: Diagnosis not present

## 2024-02-09 LAB — RESP PANEL BY RT-PCR (RSV, FLU A&B, COVID)  RVPGX2
Influenza A by PCR: NEGATIVE
Influenza B by PCR: NEGATIVE
Resp Syncytial Virus by PCR: NEGATIVE
SARS Coronavirus 2 by RT PCR: NEGATIVE

## 2024-02-09 LAB — GROUP A STREP BY PCR: Group A Strep by PCR: NOT DETECTED

## 2024-02-09 MED ORDER — PREDNISONE 20 MG PO TABS
60.0000 mg | ORAL_TABLET | Freq: Once | ORAL | Status: AC
  Administered 2024-02-09: 60 mg via ORAL
  Filled 2024-02-09: qty 3

## 2024-02-09 NOTE — Discharge Instructions (Addendum)
 You can continue taking the cough suppressant medication you have at home as well as Tylenol and ibuprofen as needed.

## 2024-02-09 NOTE — ED Triage Notes (Signed)
 Pt reports sore throat cough and congestion x3 days

## 2024-02-09 NOTE — ED Provider Notes (Signed)
 Emergency department handoff note  Care of this patient was signed out to me at the end of the previous provider shift.  All pertinent patient information was conveyed and all questions were answered.  Patient pending chest x-ray results that showed no evidence of pneumonia however does show persistent left lower lobe atelectasis versus scarring.  Given the persistence, this is likely scarring.  The patient has been reexamined and is ready to be discharged.  All diagnostic results have been reviewed and discussed with the patient/family.  Care plan has been outlined and the patient/family understands all current diagnoses, results, and treatment plans.  There are no new complaints, changes, or physical findings at this time.  All questions have been addressed and answered.  Patient was instructed to, and agrees to follow-up with their primary care physician as well as return to the emergency department if any new or worsening symptoms develop.   Merwyn Katos, MD 02/09/24 [phone]

## 2024-02-09 NOTE — ED Provider Notes (Signed)
 [facility] Provider Note    Event Date/Time   First MD Initiated Contact with Patient 02/09/24 [phone]     (approximate)   History   Sore Throat   HPI Cynthia Atkins is a 33 y.o. female presenting today for sore throat and congestion.  Patient states for the past 3 days she has had nonproductive cough, congestion, and intermittent sore throat.  Mostly she is dealing with the cough at this point.  Denies any specific fevers, chest pain, shortness of breath, nausea, body aches.  No obvious sick contacts.  Reports history of pneumonia with similar symptoms to this.     Physical Exam   Triage Vital Signs: ED Triage Vitals  Encounter Vitals Group     BP 02/09/24 0217 123/82     Systolic BP Percentile --      Diastolic BP Percentile --      Pulse Rate 02/09/24 0217 (!) 105     Resp 02/09/24 0217 20     Temp 02/09/24 0217 98.5 F (36.9 C)     Temp Source 02/09/24 0217 Oral     SpO2 02/09/24 0217 98 %     Weight 02/09/24 0217 205 lb (93 kg)     Height 02/09/24 0217 5\' 7"  (1.702 m)     Head Circumference --      Peak Flow --      Pain Score 02/09/24 0216 0     Pain Loc --      Pain Education --      Exclude from Growth Chart --     Most recent vital signs: Vitals:   02/09/24 0217 02/09/24 0535  BP: 123/82 124/89  Pulse: (!) 105 79  Resp: 20 16  Temp: 98.5 F (36.9 C) 98.3 F (36.8 C)  SpO2: 98% 98%   Physical Exam: I have reviewed the vital signs and nursing notes. General: Awake, alert, no acute distress.  Nontoxic appearing. Head:  Atraumatic, normocephalic.   ENT:  EOM intact, PERRL. Oral mucosa is pink and moist with no lesions. Neck: Neck is supple with full range of motion, No meningeal signs. Cardiovascular:  RRR, No murmurs. Peripheral pulses palpable and equal bilaterally. Respiratory:  Symmetrical chest wall expansion.  No rhonchi, rales, or wheezes.  Good air movement throughout.  No use of accessory muscles.    Musculoskeletal:  No cyanosis or edema. Moving extremities with full ROM Abdomen:  Soft, nontender, nondistended. Neuro:  GCS 15, moving all four extremities, interacting appropriately. Speech clear. Psych:  Calm, appropriate.   Skin:  Warm, dry, no rash.    ED Results / Procedures / Treatments   Labs (all labs ordered are listed, but only abnormal results are displayed) Labs Reviewed  GROUP A STREP BY PCR  RESP PANEL BY RT-PCR (RSV, FLU A&B, COVID)  RVPGX2     EKG    RADIOLOGY Independently interpreted chest x-ray with no obvious abnormalities   PROCEDURES:  Critical Care performed: No  Procedures   MEDICATIONS ORDERED IN ED: Medications  predniSONE (DELTASONE) tablet 60 mg (60 mg Oral Given 02/09/24 0536)     IMPRESSION / MDM / ASSESSMENT AND PLAN / ED COURSE  I reviewed the triage vital signs and the nursing notes.                              Differential diagnosis includes, but is not limited to, COVID, flu, RSV, viral URI,  pneumonia, bronchitis, viral pharyngitis  Patient's presentation is most consistent with acute complicated illness / injury requiring diagnostic workup.  Patient is a 33 year old female presenting today for cough, congestion, and sore throat.  Physical exam largely unremarkable and vital signs are stable.  No obvious crackles or wheezing.  No significant posterior oropharynx erythema.  Given her sore throat symptoms with negative strep test, give a one-time dose of prednisone potentially to help with inflammation.  Negative for COVID, flu, and RSV.  My interpretation of the chest x-ray showed no obvious pneumonia.  Patient wanted to wait for formal radiology read before discharge.  Signed out to oncoming provider pending formal radiology read.  Suspect she will still be able to go home afterwards whether it is +/- antibiotics.     FINAL CLINICAL IMPRESSION(S) / ED DIAGNOSES   Final diagnoses:  Viral URI with cough     Rx / DC Orders    ED Discharge Orders     None        Note:  This document was prepared using Dragon voice recognition software and may include unintentional dictation errors.   Janith Lima, MD 02/09/24 [phone]

## 2024-02-23 DIAGNOSIS — Z0000 Encounter for general adult medical examination without abnormal findings: Secondary | ICD-10-CM | POA: Diagnosis not present

## 2024-02-23 DIAGNOSIS — Z13220 Encounter for screening for lipoid disorders: Secondary | ICD-10-CM | POA: Diagnosis not present

## 2024-02-25 DIAGNOSIS — Z13220 Encounter for screening for lipoid disorders: Secondary | ICD-10-CM | POA: Diagnosis not present

## 2024-02-25 DIAGNOSIS — Z0000 Encounter for general adult medical examination without abnormal findings: Secondary | ICD-10-CM | POA: Diagnosis not present

## 2024-03-12 DIAGNOSIS — Z419 Encounter for procedure for purposes other than remedying health state, unspecified: Secondary | ICD-10-CM | POA: Diagnosis not present

## 2024-04-11 DIAGNOSIS — Z419 Encounter for procedure for purposes other than remedying health state, unspecified: Secondary | ICD-10-CM | POA: Diagnosis not present

## 2024-05-06 ENCOUNTER — Ambulatory Visit
Admission: EM | Admit: 2024-05-06 | Discharge: 2024-05-06 | Disposition: A | Discharge status: Home / Self Care | Attending: Emergency Medicine | Admitting: Emergency Medicine

## 2024-05-06 DIAGNOSIS — J039 Acute tonsillitis, unspecified: Secondary | ICD-10-CM | POA: Diagnosis not present

## 2024-05-06 LAB — POCT RAPID STREP A (OFFICE): Rapid Strep A Screen: NEGATIVE

## 2024-05-06 MED ORDER — AMOXICILLIN 500 MG PO CAPS: 500.0000 mg | ORAL_CAPSULE | Freq: Two times a day (BID) | ORAL | 0 refills | Status: AC

## 2024-05-06 MED ORDER — FLUCONAZOLE 150 MG PO TABS: 150.0000 mg | ORAL_TABLET | ORAL | 0 refills | Status: AC | PRN

## 2024-05-06 NOTE — ED Triage Notes (Signed)
 Pt c/o sore throat x2 days

## 2024-05-06 NOTE — Discharge Instructions (Signed)
 Your evaluated for your sore throat, on exam there are Davion Flannery patches and your tonsils are swollen but the throat is not red  Rapid strep test is negative, has been sent to lab to see if it will grow bacteria you will be notified if this occurs  Begin amoxicillin  twice daily for 7 days for empirical coverage of bacteria  May use Diflucan  as needed for vaginal symptoms  May attempt throat lozenges soft liquids, salt water gargles and soft food for comfort  May take Tylenol and or Motrin for pain  May follow-up with urgent care as needed

## 2024-05-06 NOTE — ED Provider Notes (Signed)
 Arlander Bellman    CSN: [id number] Arrival date & time: 05/06/24  1739      History   Chief Complaint Chief Complaint  Patient presents with   Sore Throat    HPI Cynthia Atkins is a 33 y.o. female.   Patient presents for evaluation of a sore throat present for 2 days.  Experienced a nonproductive cough overnight which has resolved.  Able to tolerate food and liquids.  No known sick contacts.  Has attempted use of ibuprofen and antihistamines.  Denies fever, congestion, ear pain.  Past Medical History:  Diagnosis Date   Sleep apnea     There are no active problems to display for this patient.   History reviewed. No pertinent surgical history.  OB History   No obstetric history on file.      Home Medications    Prior to Admission medications   Medication Sig Start Date End Date Taking? Authorizing Provider  amoxicillin  (AMOXIL ) 500 MG capsule Take 1 capsule (500 mg total) by mouth 2 (two) times daily for 7 days. 05/06/24 05/13/24 Yes Monterrius Cardosa R, NP  fluconazole  (DIFLUCAN ) 150 MG tablet Take 1 tablet (150 mg total) by mouth every three (3) days as needed for up to 2 doses. 05/06/24  Yes Reena Canning, NP    Family History History reviewed. No pertinent family history.  Social History Social History   Tobacco Use   Smoking status: Never   Smokeless tobacco: Never  Vaping Use   Vaping status: Never Used  Substance Use Topics   Alcohol use: Never   Drug use: Never     Allergies   Dust mite extract and Peanut oil   Review of Systems Review of Systems   Physical Exam Triage Vital Signs ED Triage Vitals  Encounter Vitals Group     BP 05/06/24 1752 110/72     Systolic BP Percentile --      Diastolic BP Percentile --      Pulse Rate 05/06/24 1752 (!) 106     Resp --      Temp 05/06/24 1752 99.3 F (37.4 C)     Temp Source 05/06/24 1752 Oral     SpO2 05/06/24 1752 95 %     Weight 05/06/24 1751 220 lb (99.8 kg)     Height  05/06/24 1751 5\' 7"  (1.702 m)     Head Circumference --      Peak Flow --      Pain Score 05/06/24 1751 4     Pain Loc --      Pain Education --      Exclude from Growth Chart --    No data found.  Updated Vital Signs BP 110/72 (BP Location: Left Arm)   Pulse (!) 106   Temp 99.3 F (37.4 C) (Oral)   Ht 5\' 7"  (1.702 m)   Wt 220 lb (99.8 kg)   LMP 04/14/2024   SpO2 95%   BMI 34.46 kg/m   Visual Acuity Right Eye Distance:   Left Eye Distance:   Bilateral Distance:    Right Eye Near:   Left Eye Near:    Bilateral Near:     Physical Exam Constitutional:      Appearance: She is well-developed.  HENT:     Right Ear: Tympanic membrane and ear canal normal.     Left Ear: Tympanic membrane and ear canal normal.     Nose: No congestion.     Mouth/Throat:  Pharynx: No oropharyngeal exudate or posterior oropharyngeal erythema.     Tonsils: Tonsillar exudate present. 3+ on the right. 3+ on the left.  Cardiovascular:     Rate and Rhythm: Normal rate and regular rhythm.     Heart sounds: Normal heart sounds.  Pulmonary:     Effort: Pulmonary effort is normal.  Musculoskeletal:     Cervical back: Normal range of motion and neck supple.  Neurological:     Mental Status: She is alert and oriented to person, place, and time.      UC Treatments / Results  Labs (all labs ordered are listed, but only abnormal results are displayed) Labs Reviewed  CULTURE, GROUP A STREP [facility])  POCT RAPID STREP A (OFFICE)    EKG   Radiology No results found.  Procedures Procedures (including critical care time)  Medications Ordered in UC Medications - No data to display  Initial Impression / Assessment and Plan / UC Course  I have reviewed the triage vital signs and the nursing notes.  Pertinent labs & imaging results that were available during my care of the patient were reviewed by me and considered in my medical decision making (see chart for details).  Acute  tonsillitis  Vital signs stable, patient in no signs of distress or toxic appearing, no erythema present on exam but there is tonsillar adenopathy and exudate, rapid strep test negative, sent for culture, prescribed amoxicillin  empirically as well as Diflucan  and recommended supportive care with follow-up as needed Final Clinical Impressions(s) / UC Diagnoses   Final diagnoses:  Acute tonsillitis, unspecified etiology   Discharge Instructions      Your evaluated for your sore throat, on exam there are Seung Nidiffer patches and your tonsils are swollen but the throat is not red  Rapid strep test is negative, has been sent to lab to see if it will grow bacteria you will be notified if this occurs  Begin amoxicillin  twice daily for 7 days for empirical coverage of bacteria  May use Diflucan  as needed for vaginal symptoms  May attempt throat lozenges soft liquids, salt water gargles and soft food for comfort  May take Tylenol and or Motrin for pain  May follow-up with urgent care as needed  ED Prescriptions     Medication Sig Dispense Auth. Provider   amoxicillin  (AMOXIL ) 500 MG capsule Take 1 capsule (500 mg total) by mouth 2 (two) times daily for 7 days. 14 capsule Arianie Couse R, NP   fluconazole  (DIFLUCAN ) 150 MG tablet Take 1 tablet (150 mg total) by mouth every three (3) days as needed for up to 2 doses. 2 tablet Jami Bogdanski R, NP      PDMP not reviewed this encounter.   Reena Canning, [place] 05/06/24 [phone]

## 2024-05-09 ENCOUNTER — Ambulatory Visit (HOSPITAL_COMMUNITY): Payer: Self-pay

## 2024-05-09 LAB — CULTURE, GROUP A STREP (THRC)

## 2024-05-12 DIAGNOSIS — Z419 Encounter for procedure for purposes other than remedying health state, unspecified: Secondary | ICD-10-CM | POA: Diagnosis not present

## 2024-05-20 DIAGNOSIS — J029 Acute pharyngitis, unspecified: Secondary | ICD-10-CM | POA: Diagnosis not present

## 2024-06-11 DIAGNOSIS — Z419 Encounter for procedure for purposes other than remedying health state, unspecified: Secondary | ICD-10-CM | POA: Diagnosis not present

## 2024-07-12 DIAGNOSIS — Z419 Encounter for procedure for purposes other than remedying health state, unspecified: Secondary | ICD-10-CM | POA: Diagnosis not present

## 2024-08-12 DIAGNOSIS — Z419 Encounter for procedure for purposes other than remedying health state, unspecified: Secondary | ICD-10-CM | POA: Diagnosis not present

## 2024-09-11 DIAGNOSIS — Z419 Encounter for procedure for purposes other than remedying health state, unspecified: Secondary | ICD-10-CM | POA: Diagnosis not present

## 2024-09-23 ENCOUNTER — Ambulatory Visit
Admission: EM | Admit: 2024-09-23 | Discharge: 2024-09-23 | Disposition: A | Discharge status: Home / Self Care | Attending: Emergency Medicine | Admitting: Emergency Medicine

## 2024-09-23 DIAGNOSIS — J069 Acute upper respiratory infection, unspecified: Secondary | ICD-10-CM | POA: Diagnosis not present

## 2024-09-23 LAB — POC COVID19/FLU A&B COMBO
Covid Antigen, POC: NEGATIVE
Influenza A Antigen, POC: NEGATIVE
Influenza B Antigen, POC: NEGATIVE

## 2024-09-23 NOTE — ED Notes (Signed)
 Patient triage by  Teresa Shelba SAUNDERS, NP

## 2024-09-23 NOTE — ED Provider Notes (Signed)
 Cynthia Atkins    CSN: [id number] Arrival date & time: 09/23/24  1532      History   Chief Complaint No chief complaint on file.   HPI [name] is a 33 y.o. female.   Patient presents for evaluation of chills, body aches, increased fatigue, nasal congestion, productive cough, intermittent headaches, bilateral ear and sinus pressure beginning 1 day ago.  Known sick contact prior.  Has attempted use of Flonase, Sudafed, antihistamine and ibuprofen.  Tolerable to food and liquids.  Denies shortness of breath or wheezing.  Past Medical History:  Diagnosis Date   Sleep apnea     There are no active problems to display for this patient.   No past surgical history on file.  OB History   No obstetric history on file.      Home Medications    Prior to Admission medications   Medication Sig Start Date End Date Taking? Authorizing Provider  fluconazole  (DIFLUCAN ) 150 MG tablet Take 1 tablet (150 mg total) by mouth every three (3) days as needed for up to 2 doses. 05/06/24   Teresa Shelba SAUNDERS, NP    Family History No family history on file.  Social History Social History   Tobacco Use   Smoking status: Never   Smokeless tobacco: Never  Vaping Use   Vaping status: Never Used  Substance Use Topics   Alcohol use: Never   Drug use: Never     Allergies   Dust mite extract and Peanut oil   Review of Systems Review of Systems   Physical Exam Triage Vital Signs ED Triage Vitals [09/23/24 1727]  Encounter Vitals Group     BP 114/81     Girls Systolic BP Percentile      Girls Diastolic BP Percentile      Boys Systolic BP Percentile      Boys Diastolic BP Percentile      Pulse Rate (!) 102     Resp 18     Temp 99.8 F (37.7 C)     Temp Source Oral     SpO2 98 %     Weight      Height      Head Circumference      Peak Flow      Pain Score      Pain Loc      Pain Education      Exclude from Growth Chart    No data found.  Updated  Vital Signs BP 114/81 (BP Location: Left Arm)   Pulse (!) 102   Temp 99.8 F (37.7 C) (Oral)   Resp 18   SpO2 98%   Visual Acuity Right Eye Distance:   Left Eye Distance:   Bilateral Distance:    Right Eye Near:   Left Eye Near:    Bilateral Near:     Physical Exam Constitutional:      Appearance: Normal appearance.  HENT:     Head: Normocephalic.     Right Ear: Tympanic membrane, ear canal and external ear normal.     Left Ear: Ear canal and external ear normal.     Nose: Congestion present.     Mouth/Throat:     Mouth: Mucous membranes are moist.     Pharynx: Oropharynx is clear. No oropharyngeal exudate or posterior oropharyngeal erythema.     Tonsils: 2+ on the right. 2+ on the left.  Eyes:     Extraocular Movements: Extraocular movements intact.  Cardiovascular:  Rate and Rhythm: Normal rate and regular rhythm.     Pulses: Normal pulses.     Heart sounds: Normal heart sounds.  Pulmonary:     Effort: Pulmonary effort is normal.     Breath sounds: Normal breath sounds.  Musculoskeletal:     Cervical back: Normal range of motion and neck supple.  Neurological:     Mental Status: She is alert and oriented to person, place, and time. Mental status is at baseline.      UC Treatments / Results  Labs (all labs ordered are listed, but only abnormal results are displayed) Labs Reviewed - No data to display  EKG   Radiology No results found.  Procedures Procedures (including critical care time)  Medications Ordered in UC Medications - No data to display  Initial Impression / Assessment and Plan / UC Course  I have reviewed the triage vital signs and the nursing notes.  Pertinent labs & imaging results that were available during my care of the patient were reviewed by me and considered in my medical decision making (see chart for details).  Viral URI with cough  Patient is in no signs of distress nor toxic appearing.  Vital signs are stable.  Low  suspicion for pneumonia, pneumothorax or bronchitis and therefore will defer imaging.  COVID and flu testing negative.  Declined muscle relaxant.May use additional over-the-counter medications as needed for supportive care.  May follow-up with urgent care as needed if symptoms persist or worsen.    Final Clinical Impressions(s) / UC Diagnoses   Final diagnoses:  None   Discharge Instructions   None    ED Prescriptions   None    PDMP not reviewed this encounter.   Teresa Shelba SAUNDERS, NP 09/23/24 1750

## 2024-09-23 NOTE — Discharge Instructions (Signed)
 Your symptoms today are most likely being caused by a virus and should steadily improve in time it can take up to 7 to 10 days before you truly start to see a turnaround however things will get better  COVID and flu test negative    You can take Tylenol and/or Ibuprofen as needed for fever reduction and pain relief.   For cough: honey 1/2 to 1 teaspoon (you can dilute the honey in water or another fluid).  You can also use guaifenesin and dextromethorphan for cough. You can use a humidifier for chest congestion and cough.  If you don't have a humidifier, you can sit in the bathroom with the hot shower running.      For sore throat: try warm salt water gargles, cepacol lozenges, throat spray, warm tea or water with lemon/honey, popsicles or ice, or OTC cold relief medicine for throat discomfort.   For congestion: take a daily anti-histamine like Zyrtec, Claritin, and a oral decongestant, such as pseudoephedrine.  You can also use Flonase 1-2 sprays in each nostril daily.   It is important to stay hydrated: drink plenty of fluids (water, gatorade/powerade/pedialyte, juices, or teas) to keep your throat moisturized and help further relieve irritation/discomfort.

## 2024-11-11 DIAGNOSIS — Z419 Encounter for procedure for purposes other than remedying health state, unspecified: Secondary | ICD-10-CM | POA: Diagnosis not present
